# Patient Record
Sex: Female | Born: 1950 | Race: Black or African American | Hispanic: No | Marital: Married | Smoking: Never smoker
Health system: Southern US, Community
[De-identification: ages and names within clinical notes are randomized; demographics above are authoritative.]

## PROBLEM LIST (undated history)

## (undated) DIAGNOSIS — T7840XA Allergy, unspecified, initial encounter: Secondary | ICD-10-CM

## (undated) DIAGNOSIS — K219 Gastro-esophageal reflux disease without esophagitis: Secondary | ICD-10-CM

## (undated) DIAGNOSIS — Z9889 Other specified postprocedural states: Secondary | ICD-10-CM

## (undated) DIAGNOSIS — J45909 Unspecified asthma, uncomplicated: Secondary | ICD-10-CM

## (undated) DIAGNOSIS — M810 Age-related osteoporosis without current pathological fracture: Secondary | ICD-10-CM

## (undated) DIAGNOSIS — R112 Nausea with vomiting, unspecified: Secondary | ICD-10-CM

## (undated) DIAGNOSIS — Z9109 Other allergy status, other than to drugs and biological substances: Secondary | ICD-10-CM

## (undated) HISTORY — DX: Allergy, unspecified, initial encounter: T78.40XA

## (undated) HISTORY — DX: Unspecified asthma, uncomplicated: J45.909

## (undated) HISTORY — DX: Other allergy status, other than to drugs and biological substances: Z91.09

## (undated) HISTORY — PX: ROOT CANAL: SHX2363

---

## 1997-08-03 ENCOUNTER — Ambulatory Visit (HOSPITAL_COMMUNITY): Admission: RE | Admit: 1997-08-03 | Discharge: 1997-08-03 | Payer: Self-pay | Admitting: Family Medicine

## 1997-08-09 ENCOUNTER — Emergency Department (HOSPITAL_COMMUNITY): Admission: EM | Admit: 1997-08-09 | Discharge: 1997-08-10 | Payer: Self-pay | Admitting: Emergency Medicine

## 1997-10-13 ENCOUNTER — Other Ambulatory Visit: Admission: RE | Admit: 1997-10-13 | Discharge: 1997-10-13 | Payer: Self-pay | Admitting: Obstetrics and Gynecology

## 1998-01-20 ENCOUNTER — Ambulatory Visit (HOSPITAL_COMMUNITY): Admission: RE | Admit: 1998-01-20 | Discharge: 1998-01-20 | Payer: Self-pay | Admitting: Family Medicine

## 1998-01-20 ENCOUNTER — Encounter: Payer: Self-pay | Admitting: Family Medicine

## 1998-03-06 ENCOUNTER — Ambulatory Visit (HOSPITAL_COMMUNITY): Admission: RE | Admit: 1998-03-06 | Discharge: 1998-03-06 | Payer: Self-pay | Admitting: Gastroenterology

## 1998-03-06 ENCOUNTER — Encounter: Payer: Self-pay | Admitting: Gastroenterology

## 1998-03-08 ENCOUNTER — Encounter: Payer: Self-pay | Admitting: Gastroenterology

## 1998-03-08 ENCOUNTER — Ambulatory Visit (HOSPITAL_COMMUNITY): Admission: RE | Admit: 1998-03-08 | Discharge: 1998-03-08 | Payer: Self-pay | Admitting: Gastroenterology

## 2002-06-10 ENCOUNTER — Ambulatory Visit (HOSPITAL_COMMUNITY): Admission: RE | Admit: 2002-06-10 | Discharge: 2002-06-10 | Payer: Self-pay | Admitting: Gastroenterology

## 2002-06-10 ENCOUNTER — Encounter: Payer: Self-pay | Admitting: Gastroenterology

## 2002-06-16 ENCOUNTER — Encounter (INDEPENDENT_AMBULATORY_CARE_PROVIDER_SITE_OTHER): Payer: Self-pay | Admitting: Gastroenterology

## 2002-07-06 ENCOUNTER — Encounter (INDEPENDENT_AMBULATORY_CARE_PROVIDER_SITE_OTHER): Payer: Self-pay | Admitting: Gastroenterology

## 2002-07-29 ENCOUNTER — Other Ambulatory Visit: Admission: RE | Admit: 2002-07-29 | Discharge: 2002-07-29 | Payer: Self-pay | Admitting: Obstetrics and Gynecology

## 2002-10-22 ENCOUNTER — Encounter: Payer: Self-pay | Admitting: Emergency Medicine

## 2002-10-22 ENCOUNTER — Emergency Department (HOSPITAL_COMMUNITY): Admission: EM | Admit: 2002-10-22 | Discharge: 2002-10-22 | Payer: Self-pay | Admitting: Emergency Medicine

## 2004-04-11 ENCOUNTER — Ambulatory Visit: Payer: Self-pay | Admitting: Gastroenterology

## 2004-04-16 ENCOUNTER — Ambulatory Visit (HOSPITAL_COMMUNITY): Admission: RE | Admit: 2004-04-16 | Discharge: 2004-04-16 | Payer: Self-pay | Admitting: Gastroenterology

## 2004-04-16 ENCOUNTER — Encounter (INDEPENDENT_AMBULATORY_CARE_PROVIDER_SITE_OTHER): Payer: Self-pay | Admitting: Gastroenterology

## 2004-11-14 ENCOUNTER — Ambulatory Visit: Payer: Self-pay | Admitting: Gastroenterology

## 2004-11-16 ENCOUNTER — Ambulatory Visit (HOSPITAL_COMMUNITY): Admission: RE | Admit: 2004-11-16 | Discharge: 2004-11-16 | Payer: Self-pay | Admitting: Gastroenterology

## 2004-11-16 ENCOUNTER — Encounter (INDEPENDENT_AMBULATORY_CARE_PROVIDER_SITE_OTHER): Payer: Self-pay | Admitting: Gastroenterology

## 2005-12-04 ENCOUNTER — Ambulatory Visit: Payer: Self-pay | Admitting: Gastroenterology

## 2005-12-05 ENCOUNTER — Ambulatory Visit: Payer: Self-pay | Admitting: Gastroenterology

## 2006-07-23 ENCOUNTER — Ambulatory Visit: Payer: Self-pay | Admitting: Gastroenterology

## 2007-01-28 ENCOUNTER — Ambulatory Visit: Payer: Self-pay | Admitting: Internal Medicine

## 2007-04-22 ENCOUNTER — Ambulatory Visit: Payer: Self-pay | Admitting: Gastroenterology

## 2007-06-27 DIAGNOSIS — K209 Esophagitis, unspecified without bleeding: Secondary | ICD-10-CM | POA: Insufficient documentation

## 2007-06-27 DIAGNOSIS — K219 Gastro-esophageal reflux disease without esophagitis: Secondary | ICD-10-CM

## 2007-06-27 DIAGNOSIS — K449 Diaphragmatic hernia without obstruction or gangrene: Secondary | ICD-10-CM | POA: Insufficient documentation

## 2007-06-27 DIAGNOSIS — J309 Allergic rhinitis, unspecified: Secondary | ICD-10-CM | POA: Insufficient documentation

## 2007-06-27 DIAGNOSIS — D126 Benign neoplasm of colon, unspecified: Secondary | ICD-10-CM

## 2007-09-01 ENCOUNTER — Telehealth: Payer: Self-pay | Admitting: Gastroenterology

## 2008-06-06 ENCOUNTER — Ambulatory Visit: Payer: Self-pay | Admitting: Internal Medicine

## 2008-06-17 ENCOUNTER — Encounter: Payer: Self-pay | Admitting: Internal Medicine

## 2008-06-17 ENCOUNTER — Ambulatory Visit: Payer: Self-pay | Admitting: Internal Medicine

## 2008-06-21 ENCOUNTER — Encounter: Payer: Self-pay | Admitting: Internal Medicine

## 2008-11-10 ENCOUNTER — Emergency Department (HOSPITAL_COMMUNITY): Admission: EM | Admit: 2008-11-10 | Discharge: 2008-11-10 | Payer: Self-pay | Admitting: Family Medicine

## 2009-05-30 ENCOUNTER — Ambulatory Visit (HOSPITAL_COMMUNITY): Admission: RE | Admit: 2009-05-30 | Discharge: 2009-05-30 | Payer: Self-pay | Admitting: Internal Medicine

## 2010-05-05 ENCOUNTER — Inpatient Hospital Stay (INDEPENDENT_AMBULATORY_CARE_PROVIDER_SITE_OTHER)
Admission: RE | Admit: 2010-05-05 | Discharge: 2010-05-05 | Disposition: A | Discharge status: Home / Self Care | Payer: BC Managed Care – PPO | Source: Ambulatory Visit | Attending: Emergency Medicine | Admitting: Emergency Medicine

## 2010-05-05 DIAGNOSIS — M719 Bursopathy, unspecified: Secondary | ICD-10-CM

## 2010-05-05 DIAGNOSIS — R6889 Other general symptoms and signs: Secondary | ICD-10-CM

## 2010-06-19 ENCOUNTER — Emergency Department (HOSPITAL_COMMUNITY)
Admission: EM | Admit: 2010-06-19 | Discharge: 2010-06-19 | Disposition: A | Discharge status: Home / Self Care | Payer: BC Managed Care – PPO | Attending: Emergency Medicine | Admitting: Emergency Medicine

## 2010-06-19 ENCOUNTER — Emergency Department (HOSPITAL_COMMUNITY): Payer: BC Managed Care – PPO

## 2010-06-19 DIAGNOSIS — IMO0001 Reserved for inherently not codable concepts without codable children: Secondary | ICD-10-CM | POA: Insufficient documentation

## 2010-06-19 DIAGNOSIS — R42 Dizziness and giddiness: Secondary | ICD-10-CM | POA: Insufficient documentation

## 2010-06-19 DIAGNOSIS — M62838 Other muscle spasm: Secondary | ICD-10-CM | POA: Insufficient documentation

## 2010-06-19 DIAGNOSIS — R51 Headache: Secondary | ICD-10-CM | POA: Insufficient documentation

## 2010-06-19 DIAGNOSIS — K219 Gastro-esophageal reflux disease without esophagitis: Secondary | ICD-10-CM | POA: Insufficient documentation

## 2010-06-19 LAB — BASIC METABOLIC PANEL
BUN: 21 mg/dL (ref 6–23)
Creatinine, Ser: 0.63 mg/dL (ref 0.4–1.2)
GFR calc non Af Amer: >60 mL/min (ref >60)
Glucose, Bld: 95 mg/dL (ref 70–99)
Potassium: 4.1 mEq/L (ref 3.5–5.1)

## 2010-06-19 LAB — URINALYSIS, ROUTINE W REFLEX MICROSCOPIC
Bilirubin Urine: NEGATIVE
Glucose, UA: NEGATIVE mg/dL
Ketones, ur: NEGATIVE mg/dL
Nitrite: NEGATIVE
Protein, ur: NEGATIVE mg/dL
pH: 6 (ref 5.0–8.0)

## 2010-06-19 LAB — DIFFERENTIAL
Eosinophils Absolute: 0.1 10*3/uL (ref 0.0–0.7)
Eosinophils Relative: 1 % (ref 0–5)
Lymphocytes Relative: 40 % (ref 12–46)
Lymphs Abs: 1.8 10*3/uL (ref 0.7–4.0)
Monocytes Absolute: 0.3 10*3/uL (ref 0.1–1.0)
Monocytes Relative: 7 % (ref 3–12)

## 2010-06-19 LAB — CBC
HCT: 33.4 % — ABNORMAL LOW (ref 36.0–46.0)
MCH: 29.5 pg (ref 26.0–34.0)
MCHC: 34.1 g/dL (ref 30.0–36.0)
MCV: 86.5 fL (ref 78.0–100.0)
Platelets: 250 10*3/uL (ref 150–400)
RDW: 12.6 % (ref 11.5–15.5)
WBC: 4.6 10*3/uL (ref 4.0–10.5)

## 2010-06-26 NOTE — Assessment & Plan Note (Signed)
[facility]                         GASTROENTEROLOGY OFFICE NOTE   BEAUTIFUL, PENSYL                    MRN:          [id number]  DATE:04/22/2007                            DOB:          07/13/1950    PRIMARY CARE PHYSICIAN:  Holley Bouche, M.D.   GASTROINTESTINAL PROBLEM LIST:  1. Routine risk for colorectal cancer status post colonoscopy 2004      with a single lipoma removed.  Followup colonoscopy recommended      2014.  2. GERD status post endoscopy 1994 and 2004 by Dr. Victorino Dike.      Hiatal hernia was described with some mild esophagitis and      duodenitis were described as well.   INTERVAL HISTORY:  Karina Small was last seen here about three months  ago.  She was seen by Mike Gip for dyspepsia, exacerbation of GERD.  She was started on a trial of Protonix twice daily and then to be  changed to once daily.  A trial of Xifaxan was also given to her.  She  stopped the Xifaxan  because she believed it caused nausea and  headaches.  She continued on the Protonix intermittently, and I believe  she has been taking it with her breakfast meal probably four to five  times a week.  She no-showed for a followup appointment with me in late  December.  She no-showed for a followup appointment with Dr. Leone Payor in  late January.  She now is here for followup.  She believes that the  Protonix that she was started on did help with some classic GERD  symptoms such as pyrosis and acid water brash.  Recently, she started  Actonel, which is a once monthly diphosphonate and said that day she had  chest pains, worsening of her GERD, some epigastric discomfort.  She  called her primary care physician, who recommended she get on OTC  Prilosec daily and to stop her Amitiza, which she had been started on  for chronic intermittent diarrhea.  She also stopped her vitamin D and  her calcium.  Currently she is taking Prilosec twice daily and Protonix  once  in the morning.  Her biggest complaint is that of left lower and  left upper quadrant discomfort.   CURRENT MEDICATIONS:  1. Protonix 40 mg once daily.  2. Prilosec OTC twice daily.   PHYSICAL EXAMINATION:  Weight 136 pounds, which is one pound less than  she was three months ago.  Blood pressure 90/56, pulse 72.  CONSTITUTIONAL:  Generally well appearing.  ABDOMEN:  Soft, nontender, nondistended.  Normal bowel sounds.   ASSESSMENT/PLAN:  60 year old woman with history of  gastroesophageal reflux disease, esophagitis, recent worsening of the  symptoms since started taking Actonel.   Actonel certainly can cause esophageal ulceration, and so I think it is  reasonable for now that she hold that.  I also arranged for her to have  an EGD performed in the next few days to check if she does have  significant damage to her esophagus.  I have recommended she restart her  vitamin D and  her calcium.  I have recommended that she stop the  Prilosec OTC and instead use her Protonix on a daily basis, taking it 20  to 30 minutes prior to her breakfast meals as that is the way the pill  is designed to work most effectively.  I will call her in a  prescription.  She is not sure that she has any more of the Protonix.  I  see no reason for any further blood tests or imaging studies at this  time.  She does not drink much caffeine.  She does not smoke cigarettes  or drink much alcohol.     Rachael Fee, MD  Electronically Signed    DPJ/MedQ  DD: 04/22/2007  DT: 04/22/2007  Job #: [id number]   cc:   Holley Bouche, M.D.

## 2010-06-26 NOTE — Assessment & Plan Note (Signed)
[facility]                         GASTROENTEROLOGY OFFICE NOTE   [name], Karina Small                    MRN:          [id number]  DATE:07/23/2006                            DOB:          [birth year]    This nice patient comes in.  Says she still has chronic constipation and  some left upper quadrant pain.  She wondered whether she has worms.  I  told her I did not think this was the problem.  She has no bleeding.  She has weight gain and not a loss. She ask me about Amitiza.  I told  her this would be fine.  She should take that along with some Align.  I  thought she might do well.  I told her the gas and bloating was all from  probably constipation.  Hopefully the Amitiza can take care of this; if  not maybe MiraLax and prunes and bulking agents could be helpful to her.  She can take some laxatives, Senokot with Senna, Peri-Colace and some  Dulcolax if necessary to give her some relief.   PHYSICAL EXAMINATION:  VITAL SIGNS:  Weight 140, blood pressure 100/66,  pulse 76 and regular.  NECK: __________ are unremarkable.  HEENT:  Oropharynx negative.  NECK:  Negative.  CHEST:  Clear.  HEART:  Revealed regular rhythm with insignificant murmur.  ABDOMEN:  Soft, nontender.  No mass or organomegaly.   IMPRESSION:  1. Chronic constipation with some left upper quadrant pain.  2. Gastroesophageal reflux disease on no medication except for      symptomatic relief.  3. History of Intestinal gas.   RECOMMENDATIONS:  That we give her trial of Amitiza and Align.  Hopefully this will help.  She needed to have better daily bowel  activity.  She is trying to do whatever it takes to accomplish this and  whether she needs laxatives for this or not.  I think she understands  this, and I have told her to follow up with  Dr. Leone Payor in the future.     Ulyess Mort, MD  Electronically Signed    SML/MedQ  DD: 07/23/2006  DT: 07/24/2006  Job #: [id number]

## 2010-06-26 NOTE — Assessment & Plan Note (Signed)
[facility]                         GASTROENTEROLOGY OFFICE NOTE   WALKER, PADDACK                    MRN:          [id number]  DATE:01/28/2007                            DOB:          [birth year]    CHIEF COMPLAINT:  Heartburn, indigestion, epigastric discomfort.   HISTORY:  Karina Small is a pleasant 60 year old female known previously to  Dr. Victorino Dike, a primary patient of Dr. Tiburcio Pea.  She does have a  history of GERD and had been endoscoped previously in 1994 with a small  hiatal hernia and moderate gastritis.  She has also had endoscopy in  2004 which did show esophagitis and duodenitis.  Colonoscopy done in  2004 with one colon polyp, which was removed.  The path on this polyp  was consistent with a lipoma.  She will be due for follow-up colonoscopy  in 2014.   At this time, the patient says that she has been having problems over  the past month with increased reflux symptoms with sour-brash symptoms,  increased belching, and daily heartburn.  She has not been on any  regular medications.  No aspirin or NSAIDs.  No recent antibiotics.  She  denies any dysphagia or odynophagia.  She also complains of a gnawing-  type epigastric pain and feels as if there is a hole in her stomach.  She says that she feels hungry all of the time and feels like she has to  keep eating to fill the hole.  She has gained approximately 10 pounds  in the past year.  She has not had any vomiting but has had some  intermittent nausea.  She has had chronic problems with mild  constipation and is now having a bowel movement every 2-3 days.  Does  use prune juice, which she says generally works fairly well.  She has  not noted any melena or hematochezia.   CURRENT MEDICATIONS:  None.   ALLERGIES:  PREDNISONE with nausea.   PAST MEDICAL HISTORY:  Completely benign other than outlined above.   SOCIAL HISTORY:  Patient is married.  She is employed as a Scientist, physiological.  She is a nonsmoker and nondrinker.   FAMILY HISTORY:  Pertinent for breast cancer in her mother.  Diabetes in  one aunt.  Mother also had colon polyps.  No family history of colon  cancer or inflammatory bowel disease.   REVIEW OF SYSTEMS:  Pertinent for allergies, sinus symptoms.  She does  have some arthritic symptoms, particularly in her hands, low back pain,  and shoulder discomfort.  Review of systems is otherwise negative.   PHYSICAL EXAMINATION:  A well-developed female in no acute distress.  Pleasant.  Height is 5 foot 1.  Weight is 137.  Pulse is 78.  HEENT:  Normocephalic and atraumatic.  EOMI.  PERRLA.  Sclerae are  anicteric.  CARDIOVASCULAR:  Regular rate and rhythm with an S1 and S2.  No murmur,  rub or gallop.  PULMONARY:  Clear to A&P.  ABDOMEN:  Soft and nontender.  There is no palpable mass or  hepatosplenomegaly.  No guarding or rebound.  RECTAL:  Not done  at this time.   IMPRESSION:  65. A 60 year old female with exacerbation of esophageal reflux, rule      out esophagitis.  2. Gnawing epigastric discomfort, rule out gastritis or peptic ulcer      disease.  Question Helicobacter pylori.  3. Irritable bowel syndrome with mild chronic constipation and      complaints of abdominal bloating.   PLAN:  1. A trial of Protonix 1 p.o. b.i.d. x1 week, then q.a.m. thereafter.  2. Trial of Xifaxan 200 mg 2 p.o. t.i.d. x10 days for IBS/bloating      symptoms.  3. Follow up with Dr. Leone Payor in 3-4 weeks.  If her upper GI symptoms      have not resolved, she may require EGD.      Mike Gip, PA-C  Electronically Signed      Rachael Fee, MD  Electronically Signed   AE/MedQ  DD: 01/30/2007  DT: 01/31/2007  Job #: [id number]   cc:   Iva Boop, MD,FACG

## 2010-06-29 NOTE — Assessment & Plan Note (Signed)
[facility]                           GASTROENTEROLOGY OFFICE NOTE   Karina Small, Karina Small                    MRN:          [id number]  DATE:12/04/2005                            DOB:          Jun 28, 1950    The patient comes in on October 24 describing increasing belching and  burning, especially in the throat, increasing abdominal gas and bloating,  especially over the past several weeks.  She has been having 1 or 2 bowel  movements a day.  They have been normal.  She states there is no blood.  She  does have some left lower quadrant pain at times.  This has been going on  for several weeks too.  It has been intermittent.  She has some nausea, but  nothing significant.  No fevers or chills.  She said she thinks that, if she  went back on her Nexium she would get a better result.  States the gas is in  her left flank area and this is what she describes as gas, but she says with  a deep breath it gets worse.  She states when she eats it does not fill her  up.  She has bad headaches if she is hungry.  She says if she drinks water  this seems to control the gas and belching to some degree.  She has no  change in her urination.   I have reviewed her chart and it indicates that she had a polypoid lesion  removed from her transverse colon, which appeared to be a lipoma, which  pathologically was a lipoma several years previous, and the remaining colon  really did not reveal any significant pathology.  The exact date was Jul 06, 2002.  She had an upper endoscopic examination on Jun 16, 2002, which  revealed a minimum amount of esophagitis and a prolapse of the antral mucosa  into the pyloric channel, which caused a partial obstructive component.  Otherwise, the mucosa itself of the duodenum was slightly granular and  probably not with any significant abnormality.  She has had some erosions on  previous endoscopic procedures.  When seen in October of 2006,  described  some recurrent abdominal pain with some constipation and some GERD with  throat burning, somewhat the same kind of symptoms she has now.  She  described at that time her stomach hurting all over, mostly the left  quadrant, as she does now, except this is the left flank.  She was using  MiraLax to help her daily bowel activity, Rolaids, and Maalox, but these  were not very helpful.  She said Nexium was not very helpful.  She says her  stomach feels sour at times and things she brought up, feeling empty.  I did  an upper GI and small bowel series to see if there was any other  obstruction, and I put her on some Reglan at that time, and I felt like this  was all an irritable bowel-type disorder.  Her upper GI and small bowel  series were essentially normal.  She had a CT of her abdomen in  2006, which  was basically unremarkable, except for noting stool all around her colon and  changes of mild constipation.  She had a CT scan of her upper abdomen and  noted an ultrasound of her abdomen as well, which was normal.   PHYSICAL EXAMINATION:  She weighed 143, blood pressure 90/50, pulse 60 and  regular.  OROPHARYNX:  Negative.  NECK:  Negative.  CHEST:  Clear.  HEART:  Regular rate and rhythm without significant murmur.  ABDOMEN:  Soft with no mass or organomegaly and nontender.   IMPRESSION:  1. Left costovertebral angle pain of questionable etiology.  2. History of irritable bowel syndrome with constipation component.  3. Gastroesophageal reflux disease with throat burning and acid reflux      that responds fairly well to Nexium.  4. Increased intestinal gas of questionable etiology, possibly food-      related, swallowing air, anxiety, depression.   RECOMMENDATIONS:  Get an ultrasound of her abdomen again because of her  pain.  Give her a diet of things that cause intestinal gas.  Start her on  some Nexium b.i.d.  Give her some Align to take 1 q. day.  I am going to  have her  get some routine labs, amylase, and lipase, and have her return in  followup in 3 to 4 weeks.            ______________________________  Ulyess Mort, MD      SML/MedQ  DD:  12/04/2005  DT:  12/05/2005  Job #:  [id number]

## 2010-08-08 ENCOUNTER — Ambulatory Visit (HOSPITAL_COMMUNITY): Payer: BC Managed Care – PPO | Attending: Internal Medicine

## 2010-08-08 DIAGNOSIS — M810 Age-related osteoporosis without current pathological fracture: Secondary | ICD-10-CM | POA: Insufficient documentation

## 2010-08-10 ENCOUNTER — Encounter (HOSPITAL_COMMUNITY): Payer: BC Managed Care – PPO

## 2011-11-19 ENCOUNTER — Encounter (HOSPITAL_COMMUNITY): Payer: Self-pay

## 2011-11-19 ENCOUNTER — Encounter (HOSPITAL_COMMUNITY): Payer: BC Managed Care – PPO

## 2011-11-19 ENCOUNTER — Encounter (HOSPITAL_COMMUNITY)
Admission: RE | Admit: 2011-11-19 | Discharge: 2011-11-19 | Disposition: A | Discharge status: Home / Self Care | Payer: BC Managed Care – PPO | Source: Ambulatory Visit | Attending: Internal Medicine | Admitting: Internal Medicine

## 2011-11-19 DIAGNOSIS — M810 Age-related osteoporosis without current pathological fracture: Secondary | ICD-10-CM | POA: Insufficient documentation

## 2011-11-19 HISTORY — DX: Age-related osteoporosis without current pathological fracture: M81.0

## 2011-11-19 HISTORY — DX: Gastro-esophageal reflux disease without esophagitis: K21.9

## 2011-11-19 MED ORDER — SODIUM CHLORIDE 0.9 % IV SOLN
Freq: Once | INTRAVENOUS | Status: AC
  Administered 2011-11-19: 15:00:00 via INTRAVENOUS

## 2011-11-19 MED ORDER — ZOLEDRONIC ACID 5 MG/100ML IV SOLN
5.0000 mg | Freq: Once | INTRAVENOUS | Status: AC
  Administered 2011-11-19: 5 mg via INTRAVENOUS
  Filled 2011-11-19: qty 100

## 2012-03-16 ENCOUNTER — Encounter: Payer: Self-pay | Admitting: Obstetrics and Gynecology

## 2012-03-16 ENCOUNTER — Ambulatory Visit: Payer: BC Managed Care – PPO | Admitting: Obstetrics and Gynecology

## 2012-03-16 VITALS — BP 92/58 | Ht 61.0 in | Wt 112.0 lb

## 2012-03-16 DIAGNOSIS — Z01419 Encounter for gynecological examination (general) (routine) without abnormal findings: Secondary | ICD-10-CM

## 2012-03-16 DIAGNOSIS — N63 Unspecified lump in unspecified breast: Secondary | ICD-10-CM | POA: Insufficient documentation

## 2012-03-16 DIAGNOSIS — Z124 Encounter for screening for malignant neoplasm of cervix: Secondary | ICD-10-CM

## 2012-03-16 NOTE — Progress Notes (Signed)
Subjective:    Karina Small is a 62 y.o. female G0P0000 who presents for annual exam. The patient has not been seen and greater than 3 years.  This is considered a new patient exam.  The patient has a known history of herpes.  She has very few to no outbreaks.The patient complaints of trouble sleeping. She has been diagnosed with osteoporosis.  She is caring for her elderly mother.  The following portions of the patient's history were reviewed and updated as appropriate: allergies, current medications, past family history, past medical history, past social history, past surgical history and problem list.  Review of Systems A comprehensive review of systems was negative. Gastrointestinal:No change in bowel habits, no abdominal pain, no rectal bleeding Genitourinary:negative for dysuria, frequency, hematuria, nocturia and urinary incontinence    Objective:     BP 92/58  Ht 5\' 1"  (1.549 m)  Wt 112 lb (50.803 kg)  BMI 21.16 kg/m2  LMP 04/04/1982  Weight:  Wt Readings from Last 1 Encounters:  03/16/12 112 lb (50.803 kg)     BMI: Body mass index is 21.16 kg/(m^2). General Appearance: Alert, appropriate appearance for age. No acute distress HEENT: Grossly normal Neck / Thyroid: Supple, no masses, nodes or enlargement Lungs: clear to auscultation bilaterally Back: No CVA tenderness Breast Exam: no skin changes or discharge from her breasts.  No masses on the right.  There is a 1 cm mobile nontender mass in the left lower outer quadrant. Cardiovascular: Regular rate and rhythm. S1, S2, no murmur Gastrointestinal: Soft, non-tender, no masses or organomegaly  ++++++++++++++++++++++++++++++++++++++++++++++++++++++++  Pelvic Exam: External genitalia: normal general appearance Vaginal: atrophic mucosa Cervix: normal appearance Adnexa: normal bimanual exam Uterus: small, nontender Rectovaginal: normal rectal, no  masses  ++++++++++++++++++++++++++++++++++++++++++++++++++++++++  Lymphatic Exam: Non-palpable nodes in neck, clavicular, axillary, or inguinal regions  Psychiatric: Alert and oriented, appropriate affect.      Assessment:    Normal gyn exam   Overweight or obese: No  Pelvic relaxation: Yes  Menopausal symptoms: Yes. Severe: No.  Left breast mass  Herpes virus-few outbreaks   Plan:    Mammogram. Pap smear.   Follow-up:  1 month to repeat breast exam.  The updated Pap smear screening guidelines were discussed with the patient. The patient requested that I obtain a Pap smear: Yes.  Kegel exercises discussed: Yes.  Proper diet and regular exercise were reviewed.  Annual mammograms recommended starting at age 69. Proper breast care was discussed.  Screening colonoscopy is recommended beginning at age 42.  Regular health maintenance was reviewed.  Sleep hygiene was discussed.  Adequate calcium and vitamin D intake was emphasized.  Leonard Schwartz M.D.   Regular Periods: Postmenopausal Mammogram: yes  Monthly Breast Ex.: yes  Exercise: yes  Tetanus < 10 years: yes Seatbelts: yes  NI. Bladder Functn.: yes Abuse at home: no  Daily BM's: yes Stressful Work: yes  Healthy Diet: yes Sigmoid-Colonoscopy: per pt 2000  Calcium: yes Medical problems this year: shoulder    LAST PAP:12/16/2007 Contraception: none  Mammogram:  04/15/2006  PCP: Dr Cato Mulligan   PMH: none  FMH: none  Last Bone Scan: per pt 2013 Osteoporosis

## 2012-03-17 ENCOUNTER — Other Ambulatory Visit: Payer: Self-pay | Admitting: Obstetrics and Gynecology

## 2012-03-17 DIAGNOSIS — N63 Unspecified lump in unspecified breast: Secondary | ICD-10-CM

## 2012-03-17 LAB — PAP IG W/ RFLX HPV ASCU

## 2012-03-21 IMAGING — CT CT HEAD W/O CM
1 of 2 series · 16 of 30 positions shown, 20 images · non-contrast
Comparison: None.

CLINICAL DATA: Headache, dizziness

CT HEAD WITHOUT CONTRAST
TECHNIQUE: Contiguous axial images were obtained from the base of
the skull through the vertex without contrast

[Series 2: head routine 4.8 h37s · axial · 0.43mm/px · z∈[+1170,+1294]mm · 16 of 30 slices shown, 20 images]
[im 2/30  brain]
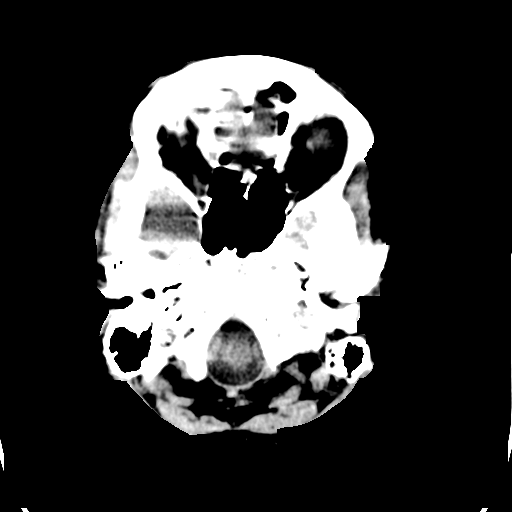
[im 2/30  bone]
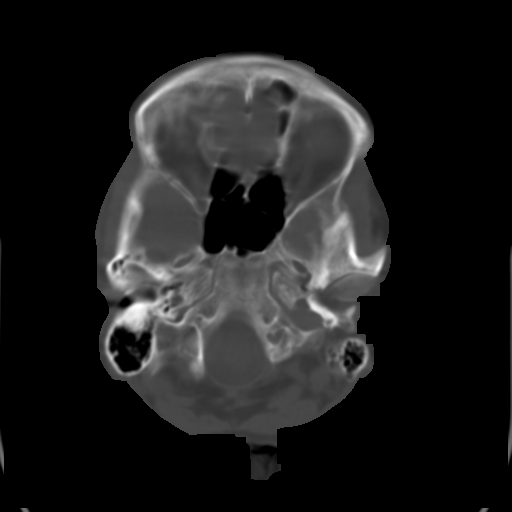
[im 3/30  brain]
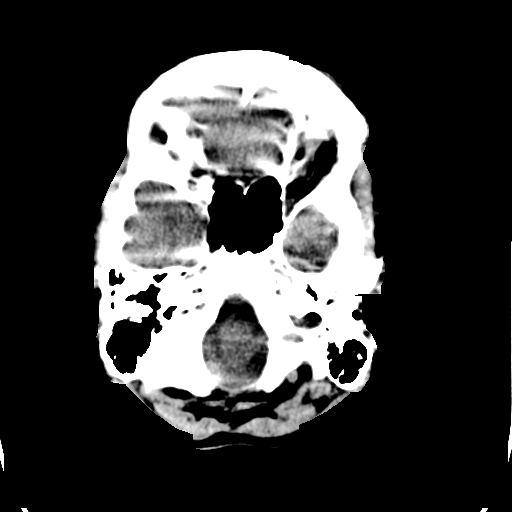
[im 5/30  brain]
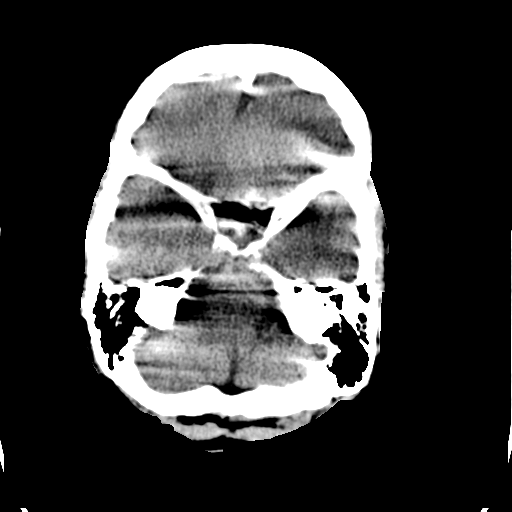
[im 8/30  brain]
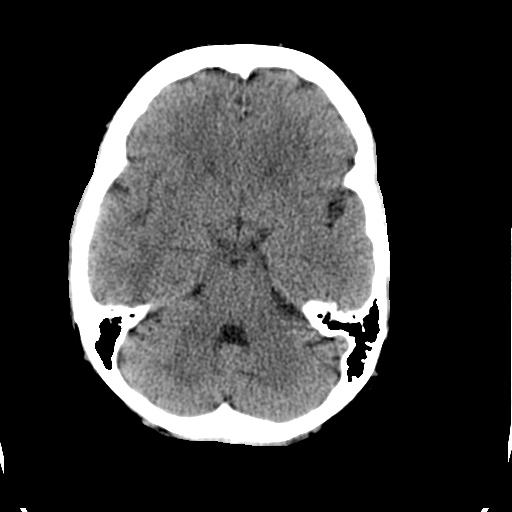
[im 9/30  brain]
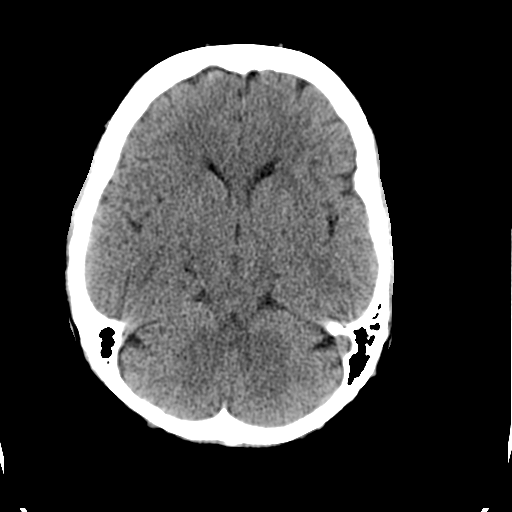
[im 9/30  bone]
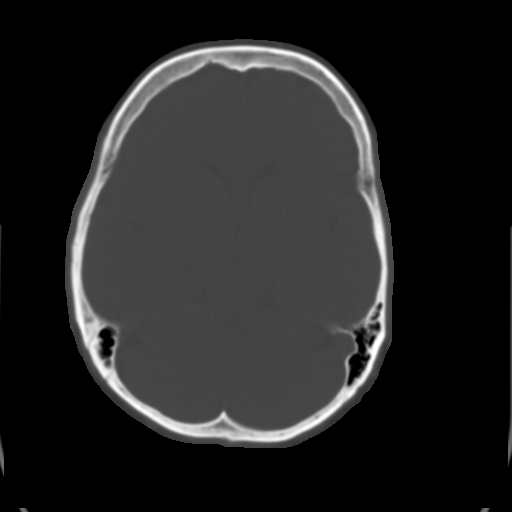
[im 11/30  brain]
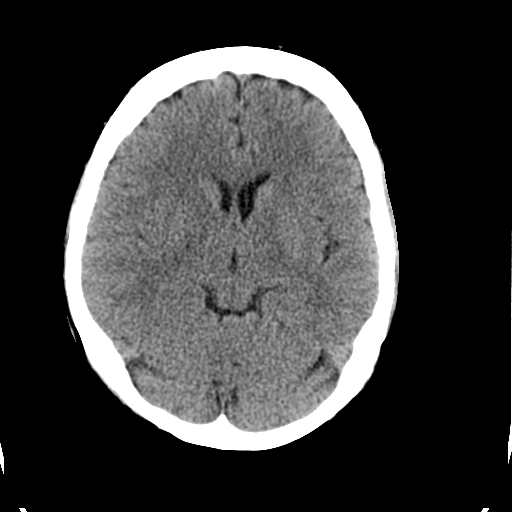
[im 12/30  brain]
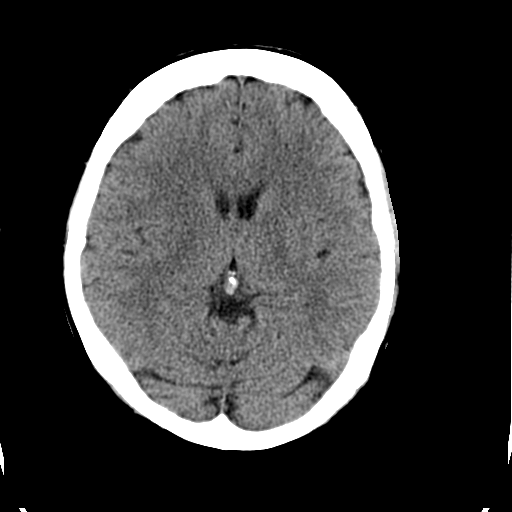
[im 14/30  brain]
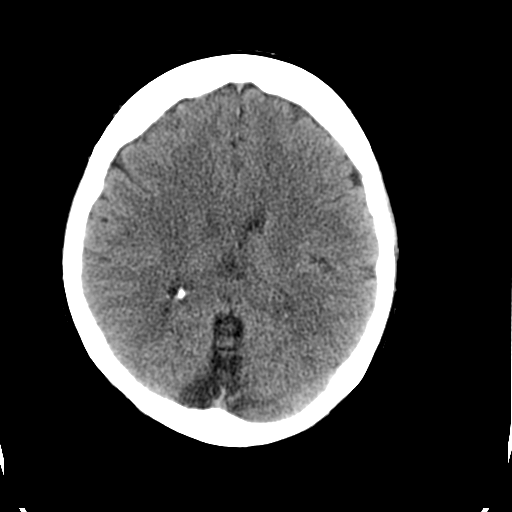
[im 16/30  brain]
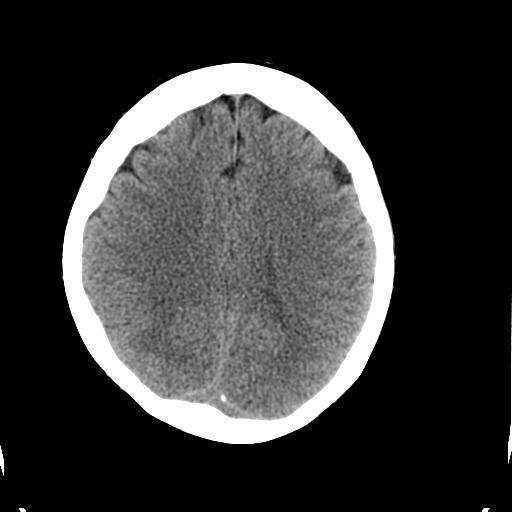
[im 16/30  bone]
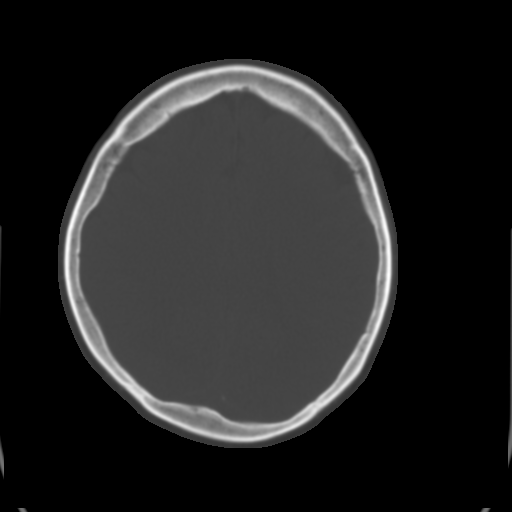
[im 18/30  brain]
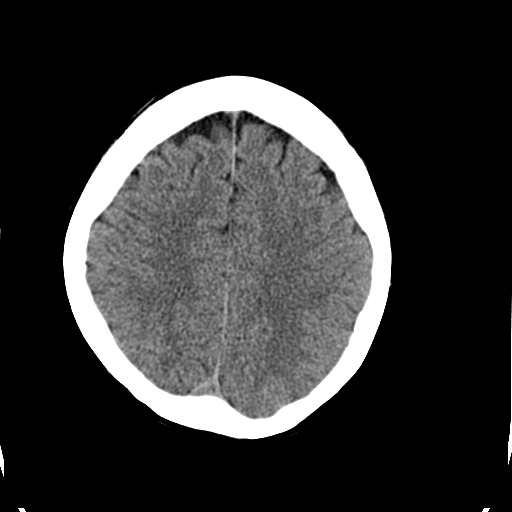
[im 19/30  brain]
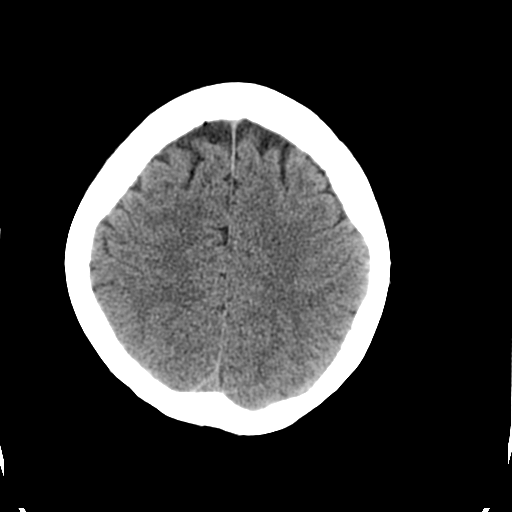
[im 21/30  brain]
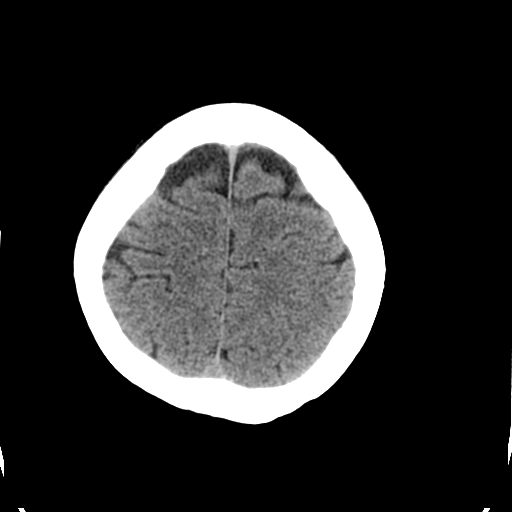
[im 22/30  brain]
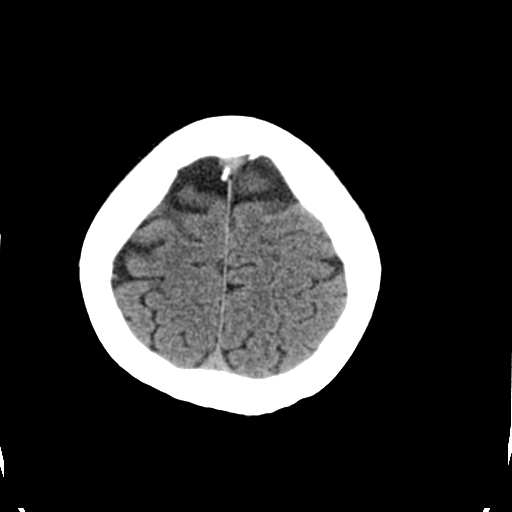
[im 22/30  bone]
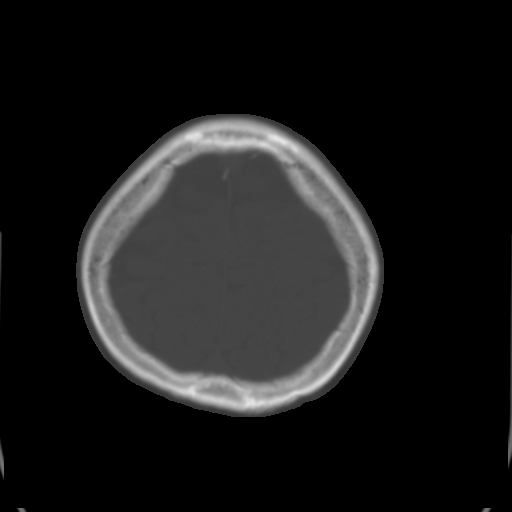
[im 25/30  brain]
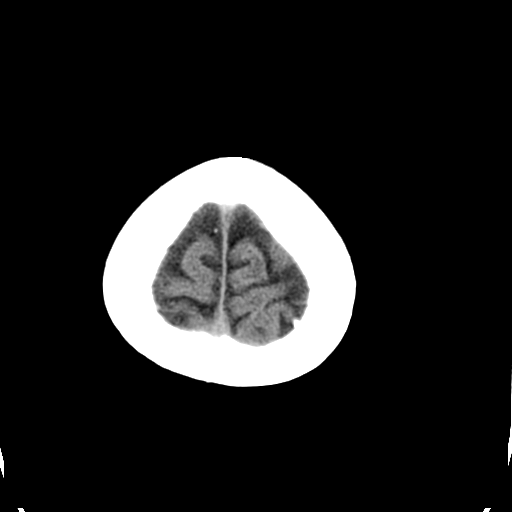
[im 27/30  brain]
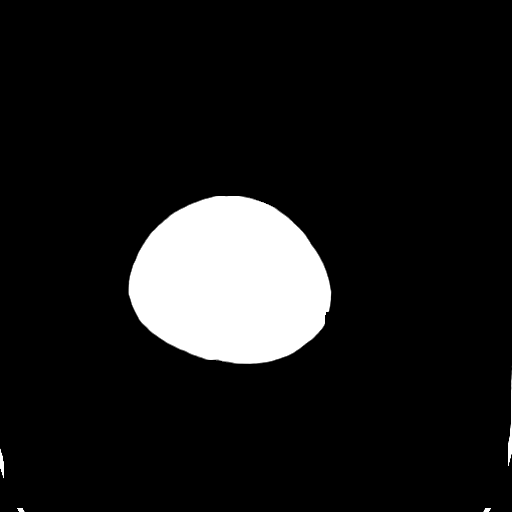
[im 28/30  brain]
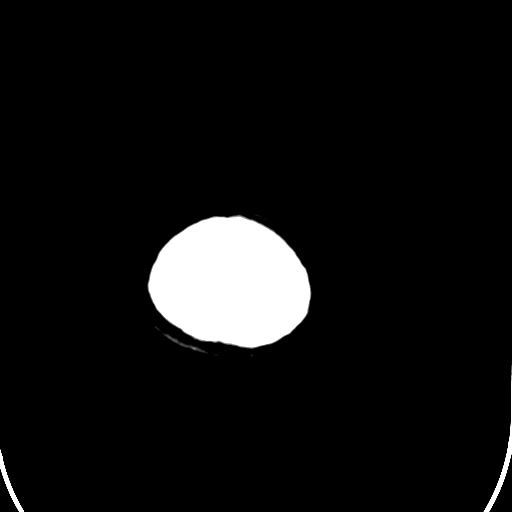

[16 of 30 positions shown; findings below may reference images not displayed]

FINDINGS: The brain has a normal appearance without evidence for
hemorrhage, acute infarction, hydrocephalus, or mass lesion.  There
is no extra axial fluid collection.  The skull and paranasal
sinuses are normal.
IMPRESSION: Normal CT of the head without contrast.

## 2012-09-18 ENCOUNTER — Emergency Department (HOSPITAL_COMMUNITY): Payer: BC Managed Care – PPO

## 2012-09-18 ENCOUNTER — Emergency Department (INDEPENDENT_AMBULATORY_CARE_PROVIDER_SITE_OTHER)
Admission: EM | Admit: 2012-09-18 | Discharge: 2012-09-18 | Disposition: A | Discharge status: Home / Self Care | Payer: BC Managed Care – PPO | Source: Home / Self Care | Attending: Family Medicine | Admitting: Family Medicine

## 2012-09-18 ENCOUNTER — Encounter (HOSPITAL_COMMUNITY): Payer: Self-pay

## 2012-09-18 DIAGNOSIS — M7918 Myalgia, other site: Secondary | ICD-10-CM

## 2012-09-18 DIAGNOSIS — IMO0001 Reserved for inherently not codable concepts without codable children: Secondary | ICD-10-CM

## 2012-09-18 MED ORDER — IBUPROFEN 600 MG PO TABS: 600.0000 mg | ORAL_TABLET | Freq: Four times a day (QID) | ORAL | Status: DC | PRN

## 2012-09-18 MED ORDER — CYCLOBENZAPRINE HCL 10 MG PO TABS: 10.0000 mg | ORAL_TABLET | Freq: Three times a day (TID) | ORAL | Status: DC | PRN
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
## 2012-09-21 NOTE — ED Provider Notes (Signed)
Medical screening examination/treatment/procedure(s) were performed by resident physician or non-physician practitioner and as supervising physician I was immediately available for consultation/collaboration.   KINDL,JAMES DOUGLAS MD.   James D Kindl, MD 09/21/12 1509 

## 2013-02-05 ENCOUNTER — Emergency Department (HOSPITAL_COMMUNITY)
Admission: EM | Admit: 2013-02-05 | Discharge: 2013-02-05 | Disposition: A | Discharge status: Home / Self Care | Payer: BC Managed Care – PPO | Source: Home / Self Care

## 2013-02-05 ENCOUNTER — Encounter (HOSPITAL_COMMUNITY): Payer: Self-pay | Admitting: Emergency Medicine

## 2013-02-05 DIAGNOSIS — J069 Acute upper respiratory infection, unspecified: Secondary | ICD-10-CM

## 2013-02-05 MED ORDER — IPRATROPIUM BROMIDE 0.06 % NA SOLN: 2.0000 | Freq: Four times a day (QID) | NASAL | Status: DC

## 2013-02-05 NOTE — ED Provider Notes (Signed)
Medical screening examination/treatment/procedure(s) were performed by non-physician practitioner and as supervising physician I was immediately available for consultation/collaboration.  Leslee Home, M.D.  Reuben Likes, MD 02/05/13 2217

## 2013-02-05 NOTE — ED Provider Notes (Signed)
CSN: [id number]     Arrival date & time 02/05/13  1143 History   First MD Initiated Contact with Patient 02/05/13 1415     No chief complaint on file.  (Consider location/radiation/quality/duration/timing/severity/associated sxs/prior Treatment) HPI Comments: And 62 year old female complaining of cough and cold. Started approximately 3 days ago with a cough and developing a headache and drainage in her throat. She has a scratchy and sore throat. Today she complaining of a deep cough. Denies fever, shortness of breath or chest pain. She is taking Mucinex DM.   Past Medical History  Diagnosis Date  . Osteoporosis   . GERD (gastroesophageal reflux disease)   . Environmental allergies    Past Surgical History  Procedure Laterality Date  . Root canal     Family History  Problem Relation Age of Onset  . Heart attack Father   . Breast cancer Mother   . Osteoporosis Mother    History  Substance Use Topics  . Smoking status: Never Smoker   . Smokeless tobacco: Never Used  . Alcohol Use: No   OB History   Grav Para Term Preterm Abortions TAB SAB Ect Mult Living   0 0 0 0 0 0 0 0 0 0      Review of Systems  Constitutional: Negative for fever, chills, activity change, appetite change and fatigue.  HENT: Positive for congestion, postnasal drip and rhinorrhea. Negative for facial swelling.   Eyes: Negative.   Respiratory: Positive for cough. Negative for shortness of breath and wheezing.   Cardiovascular: Negative.   Gastrointestinal: Negative.   Musculoskeletal: Negative for neck pain and neck stiffness.  Skin: Negative for pallor and rash.  Neurological: Negative.     Allergies  Prednisone and Risedronate sodium  Home Medications   Current Outpatient Rx  Name  Route  Sig  Dispense  Refill  . Calcium Carbonate-Vitamin D (LIQUID CALCIUM/VITAMIN D PO)   Oral   Take by mouth 2 (two) times daily.         . cyclobenzaprine (FLEXERIL) 10 MG tablet   Oral   Take 1 tablet  (10 mg total) by mouth 3 (three) times daily as needed for muscle spasms.   30 tablet   0   . ibuprofen (ADVIL,MOTRIN) 600 MG tablet   Oral   Take 1 tablet (600 mg total) by mouth every 6 (six) hours as needed for pain.   30 tablet   0   . ipratropium (ATROVENT) 0.06 % nasal spray   Each Nare   Place 2 sprays into both nostrils 4 (four) times daily.   15 mL   12   . loratadine (CLARITIN) 10 MG tablet   Oral   Take 10 mg by mouth as needed.         . ranitidine (ZANTAC) 150 MG tablet   Oral   Take 150 mg by mouth as needed.         . zoledronic acid (RECLAST) 5 MG/100ML SOLN   Intravenous   Inject 5 mg into the vein once.          BP 119/67  Pulse 72  Temp(Src) 98.8 F (37.1 C) (Oral)  Resp 16  Ht 5\' 1"  (1.549 m)  Wt 115 lb (52.164 kg)  BMI 21.74 kg/m2  SpO2 100%  LMP 04/04/1982 Physical Exam  Nursing note and vitals reviewed. Constitutional: She is oriented to person, place, and time. She appears well-developed and well-nourished. No distress.  HENT:  Mouth/Throat: No oropharyngeal exudate.  Bilateral TMs are occluded with wax. Oropharynx with minor erythema and injection, copious clear PND.  Eyes: Conjunctivae and EOM are normal.  Neck: Normal range of motion. Neck supple.  Cardiovascular: Normal rate, regular rhythm and normal heart sounds.   Pulmonary/Chest: Effort normal and breath sounds normal. No respiratory distress. She has no wheezes. She has no rales.  Musculoskeletal: Normal range of motion. She exhibits no edema.  Lymphadenopathy:    She has no cervical adenopathy.  Neurological: She is alert and oriented to person, place, and time.  Skin: Skin is warm and dry. No rash noted.  Psychiatric: She has a normal mood and affect.    ED Course  Procedures (including critical care time) Labs Review Labs Reviewed - No data to display Imaging Review No results found.    MDM   1. URI (upper respiratory infection)      Continue the  Mucinex DM and had an Alka-Seltzer cold plus. Also Atrovent nasal spray as directed. May also use nasal saline spray frequently, Tylenol when necessary and regular URI instructions.  Hayden Rasmussen, NP 02/05/13 [phone]

## 2013-02-05 NOTE — ED Notes (Signed)
Cough, headache , uri

## 2014-08-19 ENCOUNTER — Encounter: Payer: Self-pay | Admitting: Gastroenterology

## 2014-08-19 ENCOUNTER — Encounter: Payer: Self-pay | Admitting: Internal Medicine

## 2014-09-29 ENCOUNTER — Ambulatory Visit (HOSPITAL_COMMUNITY)
Admission: RE | Admit: 2014-09-29 | Discharge: 2014-09-29 | Disposition: A | Discharge status: Home / Self Care | Payer: BC Managed Care – PPO | Source: Ambulatory Visit | Attending: Internal Medicine | Admitting: Internal Medicine

## 2014-09-29 ENCOUNTER — Encounter (HOSPITAL_COMMUNITY): Payer: Self-pay

## 2014-09-29 ENCOUNTER — Encounter (INDEPENDENT_AMBULATORY_CARE_PROVIDER_SITE_OTHER): Payer: Self-pay

## 2014-09-29 ENCOUNTER — Other Ambulatory Visit (HOSPITAL_COMMUNITY): Payer: Self-pay | Admitting: Internal Medicine

## 2014-09-29 DIAGNOSIS — M810 Age-related osteoporosis without current pathological fracture: Secondary | ICD-10-CM | POA: Diagnosis not present

## 2014-09-29 HISTORY — DX: Other specified postprocedural states: Z98.890

## 2014-09-29 HISTORY — DX: Nausea with vomiting, unspecified: R11.2

## 2014-09-29 MED ORDER — SODIUM CHLORIDE 0.9 % IV SOLN
Freq: Once | INTRAVENOUS | Status: AC
  Administered 2014-09-29: 10:00:00 via INTRAVENOUS

## 2014-09-29 MED ORDER — ZOLEDRONIC ACID 5 MG/100ML IV SOLN
5.0000 mg | Freq: Once | INTRAVENOUS | Status: AC
  Administered 2014-09-29: 5 mg via INTRAVENOUS
  Filled 2014-09-29: qty 100

## 2014-09-29 NOTE — Discharge Instructions (Signed)

## 2015-04-05 ENCOUNTER — Encounter: Payer: Self-pay | Admitting: Podiatry

## 2015-04-05 ENCOUNTER — Ambulatory Visit (INDEPENDENT_AMBULATORY_CARE_PROVIDER_SITE_OTHER): Payer: BC Managed Care – PPO | Admitting: Podiatry

## 2015-04-05 VITALS — BP 115/73 | HR 74 | Resp 14

## 2015-04-05 DIAGNOSIS — L081 Erythrasma: Secondary | ICD-10-CM | POA: Diagnosis not present

## 2015-04-05 DIAGNOSIS — B353 Tinea pedis: Secondary | ICD-10-CM

## 2015-04-05 MED ORDER — ERYTHROMYCIN BASE 500 MG PO TABS: 500.0000 mg | ORAL_TABLET | Freq: Two times a day (BID) | ORAL | Status: DC

## 2015-04-05 NOTE — Progress Notes (Signed)
   Subjective:    Patient ID: Karina Small, female    DOB: 1950/12/11, 65 y.o.   MRN: [id number]  HPI this patient presents to the office with chief complaint of itching on both feet. Her second complaint is her skin between her third and fourth and fourth and fifth toes has broken down, exposing her tissue. She says that the blisters on both feet are causing her to itch and she has purchased and applied tinactin  cream to the sites. She has not applied any medication between the toes of the left foot. She states that there is no pain associated with the toes on the left foot. She presents the office for an evaluation and treatment   The patient presents here today for left foot, 4th and 5th toes broken skin in between the toes and the outer 5th toe since 1 month. Review of Systems     Objective:   Physical Exam GENERAL APPEARANCE: Alert, conversant. Appropriately groomed. No acute distress.  VASCULAR: Pedal pulses palpable at  [facility] and PT bilateral.  Capillary refill time is immediate to all digits,  Normal temperature gradient.  Digital hair growth is present bilateral  NEUROLOGIC: sensation is normal to 5.07 monofilament at 5/5 sites bilateral.  Light touch is intact bilateral, Muscle strength normal.  MUSCULOSKELETAL: acceptable muscle strength, tone and stability bilateral.  Intrinsic muscluature intact bilateral.  Rectus appearance of foot and digits noted bilateral.   DERMATOLOGIC: skin color, texture, and turgor are within normal limits.  No preulcerative lesions or ulcers  are seen, no interdigital maceration noted.  No open lesions present.  Digital nails are asymptomatic. No drainage noted. She has blister formation on dorsolateral aspect left foot at 5th PIPJ. There is ulceration between her 3/4 and 4/5 toes left foot.         Assessment & Plan:  Tinea pedis B/L   Erythrasma left foot.  ROV  Told to bandage and apply betadine cream between toes as well as prescribed  erythromycin 500 mg one bid.  Use tinactin since the blisters are drying.  RTC 10 days.

## 2015-04-13 ENCOUNTER — Ambulatory Visit (INDEPENDENT_AMBULATORY_CARE_PROVIDER_SITE_OTHER): Payer: BC Managed Care – PPO | Admitting: Podiatry

## 2015-04-13 ENCOUNTER — Encounter: Payer: Self-pay | Admitting: Podiatry

## 2015-04-13 VITALS — BP 105/61 | HR 84 | Resp 12

## 2015-04-13 DIAGNOSIS — B353 Tinea pedis: Secondary | ICD-10-CM | POA: Diagnosis not present

## 2015-04-13 DIAGNOSIS — L081 Erythrasma: Secondary | ICD-10-CM | POA: Diagnosis not present

## 2015-04-13 NOTE — Progress Notes (Signed)
Subjective:     Patient ID: Karina Small, female   DOB: 1950/05/16, 65 y.o.   MRN: [id number]  HPI this patient presents the office follow-up for diagnosis of tinea pedis both feet and erythrasma third and fourth inner spaces left foot. She has been applying medication to the interspace as well as taking erythromycin for the erythrasma. She has used the Tinactin to help dry the blisters formed from her tinea pedis patient was having no further symptoms at this time   Review of Systems     Objective:   Physical Examneurovascular status intact.  Her multiple blisters have dried.  Her erythrasma has been replaced with normal skin.     Assessment:     Tinea pedis  Erythrasma     Plan:     **ROV  Discharge.  RTC prn   Gardiner Barefoot DPM

## 2015-04-26 ENCOUNTER — Telehealth: Payer: Self-pay | Admitting: *Deleted

## 2015-04-26 NOTE — Telephone Encounter (Signed)
Pt states she had performed the soaks and used the cream on her feet, and the areas dried up, but 2-3 days later the left toe opened up again.  I encouraged pt to continue the Dr. Burnell Blanks soaks, and the cream and to be sure to dry areas completely after baths, and soaks and to allow to air dry when possible.  Pt states she has an appt 05/04/2015, but would like to know if she should be on the antibiotic Dr. Prudence Davidson prescribed again.

## 2015-05-01 ENCOUNTER — Telehealth: Payer: Self-pay | Admitting: *Deleted

## 2015-05-01 MED ORDER — ERYTHROMYCIN BASE 500 MG PO TABS: 500.0000 mg | ORAL_TABLET | Freq: Two times a day (BID) | ORAL | Status: DC

## 2015-05-01 NOTE — Telephone Encounter (Addendum)
Dr. Prudence Davidson called states refill Karina Small's E-Mycin.  Informed Karina Small and refilled as previously.

## 2015-05-04 ENCOUNTER — Ambulatory Visit (INDEPENDENT_AMBULATORY_CARE_PROVIDER_SITE_OTHER): Payer: BC Managed Care – PPO | Admitting: Podiatry

## 2015-05-04 DIAGNOSIS — B353 Tinea pedis: Secondary | ICD-10-CM | POA: Diagnosis not present

## 2015-05-04 DIAGNOSIS — L081 Erythrasma: Secondary | ICD-10-CM | POA: Diagnosis not present

## 2015-05-04 NOTE — Progress Notes (Signed)
Subjective:     Patient ID: Karina Small, female   DOB: March 02, 1950, 65 y.o.   MRN: [id number]  HPI His patient presents to the office complaining of active infection to the skin between her 4,5 toes left foot.  She was seen weeks ago and diagnosed with tinea pedis both feet and c. minitissum 4th interspace left foot.  The tinea pedis has dried and is not causing any problems.   The 4th interspace has started draining again after it initially responded to e-mycin.  He was given a refill this past Tuesday and has taken the medicine for only 2 days.  The interspace has started improving but she desires local treatment. She presents for evaluation and treatment.   Review of Systems     Objective:   Physical Exam GENERAL APPEARANCE: Alert, conversant. Appropriately groomed. No acute distress.  VASCULAR: Pedal pulses are  palpable at  [facility] and PT bilateral.  Capillary refill time is immediate to all digits,  Normal temperature gradient.  Digital hair growth is present bilateral  NEUROLOGIC: sensation is normal to 5.07 monofilament at 5/5 sites bilateral.  Light touch is intact bilateral, Muscle strength normal.  MUSCULOSKELETAL: acceptable muscle strength, tone and stability bilateral.  Intrinsic muscluature intact bilateral.  Rectus appearance of foot and digits noted bilateral.   DERMATOLOGIC: skin color, texture, and turgor are within normal limits.  No preulcerative lesions or ulcers  are seen,   No open lesions present.  Digital nails are asymptomatic. There is masceration and drainage to the skin fourth interspace left foot.  Return of her initial infection noted.      Assessment:     Skin infection/ fungal infection  4th interspace left foot.     Plan:     ROV. Told her to soak in tea or vinegar.  Told her to apply lamisil or lotrimin cream locally to interspace.  RTC prn   Gardiner Barefoot DPM

## 2015-05-24 ENCOUNTER — Encounter: Payer: Self-pay | Admitting: Podiatry

## 2015-05-24 ENCOUNTER — Ambulatory Visit (INDEPENDENT_AMBULATORY_CARE_PROVIDER_SITE_OTHER): Payer: BC Managed Care – PPO | Admitting: Podiatry

## 2015-05-24 VITALS — BP 111/53 | HR 58 | Resp 12

## 2015-05-24 DIAGNOSIS — L081 Erythrasma: Secondary | ICD-10-CM | POA: Diagnosis not present

## 2015-05-24 DIAGNOSIS — B353 Tinea pedis: Secondary | ICD-10-CM | POA: Diagnosis not present

## 2015-05-24 MED ORDER — ERYTHROMYCIN BASE 500 MG PO TABS: 500.0000 mg | ORAL_TABLET | Freq: Two times a day (BID) | ORAL | Status: DC

## 2015-05-24 MED ORDER — GENTIAN VIOLET 2 % EX SOLN
0.5000 mL | Freq: Two times a day (BID) | CUTANEOUS | Status: DC
Start: 2015-05-24 — End: 2016-09-30

## 2015-05-24 NOTE — Progress Notes (Signed)
Subjective:     Patient ID: Karina Small, female   DOB: 1951-01-21, 64 y.o.   MRN: [id number]  HPI this patient presents to the office with continued pain, discomfort between the fourth and fifth toes of her left foot. She has been treated initially with erythromycin and Lamisil which has cleared up the infection, between her toes. She says this past Sunday. the toe broke  down again and started draining itching and burning. She returns the office today for continued evaluation and treatment of this condition. She was diagnosed with coronary back 2 mm trapezium as well as possible tinea pedis   Review of Systems     Objective:   Physical Exam GENERAL APPEARANCE: Alert, conversant. Appropriately groomed. No acute distress.  VASCULAR: Pedal pulses are  palpable at  [facility] and PT bilateral.  Capillary refill time is immediate to all digits,  Normal temperature gradient.  Digital hair growth is present bilateral  NEUROLOGIC: sensation is normal to 5.07 monofilament at 5/5 sites bilateral.  Light touch is intact bilateral, Muscle strength normal.  MUSCULOSKELETAL: acceptable muscle strength, tone and stability bilateral.  Intrinsic muscluature intact bilateral.  Rectus appearance of foot and digits noted bilateral.   DERMATOLOGIC: skin color, texture, and turgor are within normal limits.  No preulcerative lesions or ulcers  are seen, no interdigital maceration noted.  No open lesions present.  Digital nails are asymptomatic.  Red inflamed drainage noted fourth interspace left foot.      Assessment:     Skin infection/ fungal interspace.     Plan:    ROV  E-mycin and prescribed gentian violet.  RTC 2 weeks for continued evaluation and treatment.   Gardiner Barefoot DPM

## 2015-05-31 ENCOUNTER — Encounter: Payer: Self-pay | Admitting: Gastroenterology

## 2016-05-23 LAB — HM PAP SMEAR: HM Pap smear: NEGATIVE

## 2016-07-27 ENCOUNTER — Other Ambulatory Visit: Payer: Self-pay

## 2016-07-27 ENCOUNTER — Ambulatory Visit (HOSPITAL_COMMUNITY)
Admission: EM | Admit: 2016-07-27 | Discharge: 2016-07-27 | Disposition: A | Discharge status: Home / Self Care | Payer: BC Managed Care – PPO | Attending: Family Medicine | Admitting: Family Medicine

## 2016-07-27 ENCOUNTER — Encounter (HOSPITAL_COMMUNITY): Payer: Self-pay

## 2016-07-27 DIAGNOSIS — J209 Acute bronchitis, unspecified: Secondary | ICD-10-CM | POA: Diagnosis not present

## 2016-07-27 MED ORDER — HYDROCOD POLST-CPM POLST ER 10-8 MG/5ML PO SUER: 5.0000 mL | Freq: Two times a day (BID) | ORAL | 0 refills | Status: AC | PRN

## 2016-07-27 NOTE — ED Provider Notes (Signed)
CSN: [id number]     Arrival date & time 07/27/16  1157 History   First MD Initiated Contact with Patient 07/27/16 1218     Chief Complaint  Patient presents with  . Cough   (Consider location/radiation/quality/duration/timing/severity/associated sxs/prior Treatment) Patient is a fairly healthy 66 y.o. Female, suddenly started experiencing chest tightness at rest 3 days ago proceeded by productive cough with yellow sputum,some shortness of breath, nasal congestion and running nose. Cough later became barky.  She called her allergist and was given Proair, which haven't been helping. She denies wheezing. She denies actual chest pain. She denies ear pain, sneezing, sinus pain, nausea, vomiting.        Past Medical History:  Diagnosis Date  . Environmental allergies   . GERD (gastroesophageal reflux disease)   . Osteoporosis   . PONV (postoperative nausea and vomiting)    Past Surgical History:  Procedure Laterality Date  . ROOT CANAL     Family History  Problem Relation Age of Onset  . Heart attack Father   . Breast cancer Mother   . Osteoporosis Mother    Social History  Substance Use Topics  . Smoking status: Never Smoker  . Smokeless tobacco: Never Used  . Alcohol use No   OB History    Gravida Para Term Preterm AB Living   0 0 0 0 0 0   SAB TAB Ectopic Multiple Live Births   0 0 0 0       Review of Systems  Constitutional: Negative for chills, fatigue and fever.  HENT: Positive for congestion and rhinorrhea. Negative for ear pain, sinus pain, sinus pressure, sneezing and sore throat.   Respiratory: Positive for cough and shortness of breath. Negative for wheezing.   Cardiovascular: Negative for chest pain and palpitations.  Gastrointestinal: Negative for abdominal pain, diarrhea and nausea.  Skin: Negative for rash.  Neurological: Positive for headaches. Negative for dizziness.    Allergies  Prednisone and Risedronate sodium  Home Medications   Prior to  Admission medications   Medication Sig Start Date End Date Taking? Authorizing Provider  Calcium Carbonate-Vitamin D (LIQUID CALCIUM/VITAMIN D PO) Take by mouth 2 (two) times daily.   Yes [provider]  cyclobenzaprine (FLEXERIL) 10 MG tablet Take 1 tablet (10 mg total) by mouth 3 (three) times daily as needed for muscle spasms. 09/18/12  Yes Waldemar Dickens, MD  erythromycin base (E-MYCIN) 500 MG tablet Take 1 tablet (500 mg total) by mouth 2 (two) times daily. 05/24/15  Yes Gardiner Barefoot, DPM  gentian violet 2 % topical solution Use as directed 0.5 mLs in the mouth or throat 2 (two) times daily. Apply to affected skin twice daily 05/24/15  Yes Gardiner Barefoot, DPM  ibuprofen (ADVIL,MOTRIN) 600 MG tablet Take 1 tablet (600 mg total) by mouth every 6 (six) hours as needed for pain. 09/18/12  Yes Waldemar Dickens, MD  ipratropium (ATROVENT) 0.06 % nasal spray Place 2 sprays into both nostrils 4 (four) times daily. 02/05/13  Yes Mabe, Shanon Brow, NP  loratadine (CLARITIN) 10 MG tablet Take 10 mg by mouth as needed.   Yes [provider]  ranitidine (ZANTAC) 150 MG tablet Take 150 mg by mouth as needed.   Yes [provider]  zoledronic acid (RECLAST) 5 MG/100ML SOLN Inject 5 mg into the vein once.   Yes [provider]  chlorpheniramine-HYDROcodone (TUSSIONEX PENNKINETIC ER) 10-8 MG/5ML SUER Take 5 mLs by mouth every 12 (twelve) hours as needed for cough. 07/27/16  08/03/16  Barry Dienes, NP   Meds Ordered and Administered this Visit  Medications - No data to display  BP (!) 144/70 (BP Location: Right Arm)   Pulse 84   Temp 99.4 F (37.4 C) (Oral)   Resp 20   LMP 04/04/1982   SpO2 100%  No data found.   Physical Exam  Constitutional: She is oriented to person, place, and time. She appears well-developed and well-nourished.  HENT:  Head: Normocephalic and atraumatic.  Right Ear: External ear normal.  Left Ear: External ear normal.  Nose: Nose normal.   Mouth/Throat: Oropharynx is clear and moist. No oropharyngeal exudate.  TM pearly gray with no erythema   Eyes: Conjunctivae and EOM are normal. Pupils are equal, round, and reactive to light.  Neck: Normal range of motion. Neck supple.  Cardiovascular:  S1 S2, normal rate, irregular rhythm, no murmur, chest wall tender to palpate  Pulmonary/Chest: Effort normal and breath sounds normal. She has no wheezes. She exhibits tenderness.  Abdominal: Soft. Bowel sounds are normal. She exhibits no mass. There is no tenderness.  Lymphadenopathy:    She has no cervical adenopathy.  Neurological: She is alert and oriented to person, place, and time.  Skin: Skin is warm and dry. No rash noted.  Psychiatric: She has a normal mood and affect.  Nursing note and vitals reviewed.   Urgent Care Course     Procedures (including critical care time)  Labs Review Labs Reviewed - No data to display  Imaging Review No results found.  MDM   1. Acute bronchitis, unspecified organism    EKG shows sinus rhythm with PAC but otherwise no evidence of heart block or conduction disturbance, no bundle branch block, no pathological Q wave, no ST segment deviation, no T wave inversion.  Patient appears well clinically. She is safe to be discharged home. Clinical symptoms most consistent with acute bronchitis. Rx for Tussionex given for cough.  Please f/u with PCP for no improvement.  ER precaution discussed.     Barry Dienes, NP 07/27/16 1309

## 2016-07-27 NOTE — ED Triage Notes (Signed)
Pt having productive cough with yellow mucus and said she is also having tightness in her chest as well since wed. Has been taking proair and mucus relief otc.

## 2016-09-10 ENCOUNTER — Encounter (HOSPITAL_COMMUNITY): Payer: Self-pay | Admitting: Emergency Medicine

## 2016-09-10 ENCOUNTER — Ambulatory Visit (HOSPITAL_COMMUNITY)
Admission: EM | Admit: 2016-09-10 | Discharge: 2016-09-10 | Disposition: A | Discharge status: Home / Self Care | Payer: BC Managed Care – PPO | Attending: Emergency Medicine | Admitting: Emergency Medicine

## 2016-09-10 DIAGNOSIS — G44219 Episodic tension-type headache, not intractable: Secondary | ICD-10-CM

## 2016-09-10 DIAGNOSIS — J320 Chronic maxillary sinusitis: Secondary | ICD-10-CM

## 2016-09-10 MED ORDER — AZITHROMYCIN 250 MG PO TABS: 250.0000 mg | ORAL_TABLET | Freq: Every day | ORAL | 0 refills | Status: DC

## 2016-09-10 MED ORDER — IPRATROPIUM BROMIDE 0.06 % NA SOLN: 2.0000 | Freq: Four times a day (QID) | NASAL | 0 refills | Status: DC

## 2016-09-10 NOTE — ED Triage Notes (Signed)
Pt reports hitting her head on Saturday on the corner of the back of a dining room chair.  Pt had a bump on her head after she hit it, but that has subsided and she has a small bruise there now.  She reports starting to feel nauseated and developed a headache yesterday.  She states she sometimes feels this way when her allergies are bothering her.

## 2016-09-10 NOTE — ED Provider Notes (Signed)
CSN: [id number]     Arrival date & time 09/10/16  1132 History   None    Chief Complaint  Patient presents with  . Headache  . bruise    right forehead  . Nausea   (Consider location/radiation/quality/duration/timing/severity/associated sxs/prior Treatment) Patient c/o hitting the edge of a chair with her left head frontal area and now has bruising.  She has been having headaches and sinus pain.  She is having URI sx's.   The history is provided by the patient.  Headache  Pain location:  Frontal Quality:  Dull Radiates to:  Does not radiate Severity currently:  5/10 Severity at highest:  5/10 Onset quality:  Sudden Duration:  2 days Timing:  Constant Chronicity:  New Associated symptoms: congestion and drainage     Past Medical History:  Diagnosis Date  . Environmental allergies   . GERD (gastroesophageal reflux disease)   . Osteoporosis   . PONV (postoperative nausea and vomiting)    Past Surgical History:  Procedure Laterality Date  . ROOT CANAL     Family History  Problem Relation Age of Onset  . Heart attack Father   . Breast cancer Mother   . Osteoporosis Mother    Social History  Substance Use Topics  . Smoking status: Never Smoker  . Smokeless tobacco: Never Used  . Alcohol use No   OB History    Gravida Para Term Preterm AB Living   0 0 0 0 0 0   SAB TAB Ectopic Multiple Live Births   0 0 0 0       Review of Systems  Constitutional: Negative.   HENT: Positive for congestion and postnasal drip.   Eyes: Negative.   Respiratory: Negative.   Cardiovascular: Negative.   Gastrointestinal: Negative.   Endocrine: Negative.   Genitourinary: Negative.   Musculoskeletal: Negative.   Allergic/Immunologic: Negative.   Neurological: Positive for headaches.  Hematological: Negative.   Psychiatric/Behavioral: Negative.     Allergies  Prednisone and Risedronate sodium  Home Medications   Prior to Admission medications   Medication Sig Start Date  End Date Taking? Authorizing Provider  aspirin 81 MG chewable tablet Chew by mouth daily.   Yes [provider]  ranitidine (ZANTAC) 150 MG tablet Take 150 mg by mouth as needed.   Yes [provider]  azithromycin (ZITHROMAX) 250 MG tablet Take 1 tablet (250 mg total) by mouth daily. Take first 2 tablets together, then 1 every day until finished. 09/10/16   Lysbeth Penner, FNP  Calcium Carbonate-Vitamin D (LIQUID CALCIUM/VITAMIN D PO) Take by mouth 2 (two) times daily.    [provider]  cyclobenzaprine (FLEXERIL) 10 MG tablet Take 1 tablet (10 mg total) by mouth 3 (three) times daily as needed for muscle spasms. 09/18/12   Waldemar Dickens, MD  erythromycin base (E-MYCIN) 500 MG tablet Take 1 tablet (500 mg total) by mouth 2 (two) times daily. 05/24/15   Gardiner Barefoot, DPM  gentian violet 2 % topical solution Use as directed 0.5 mLs in the mouth or throat 2 (two) times daily. Apply to affected skin twice daily 05/24/15   Gardiner Barefoot, DPM  ibuprofen (ADVIL,MOTRIN) 600 MG tablet Take 1 tablet (600 mg total) by mouth every 6 (six) hours as needed for pain. 09/18/12   Waldemar Dickens, MD  ipratropium (ATROVENT) 0.06 % nasal spray Place 2 sprays into both nostrils 4 (four) times daily. 02/05/13   Janne Napoleon, NP  ipratropium (ATROVENT) 0.06 % nasal  spray Place 2 sprays into both nostrils 4 (four) times daily. 09/10/16   Lysbeth Penner, FNP  loratadine (CLARITIN) 10 MG tablet Take 10 mg by mouth as needed.    [provider]  zoledronic acid (RECLAST) 5 MG/100ML SOLN Inject 5 mg into the vein once.    [provider]   Meds Ordered and Administered this Visit  Medications - No data to display  BP 114/60 (BP Location: Left Arm)   Pulse 88   Temp 98.4 F (36.9 C) (Oral)   LMP 04/04/1982   SpO2 100%  No data found.   Physical Exam  Constitutional: She is oriented to person, place, and time. She appears well-developed and well-nourished.  HENT:   Head: Normocephalic and atraumatic.  Eyes: Pupils are equal, round, and reactive to light. Conjunctivae and EOM are normal.  Neck: Normal range of motion.  Cardiovascular: Normal rate, regular rhythm and normal heart sounds.   Pulmonary/Chest: Effort normal and breath sounds normal.  Abdominal: Soft. Bowel sounds are normal.  Neurological: She is alert and oriented to person, place, and time.  Nursing note and vitals reviewed.   Urgent Care Course     Procedures (including critical care time)  Labs Review Labs Reviewed - No data to display  Imaging Review No results found.   Visual Acuity Review  Right Eye Distance:   Left Eye Distance:   Bilateral Distance:    Right Eye Near:   Left Eye Near:    Bilateral Near:         MDM   1. Episodic tension-type headache, not intractable   2. Chronic maxillary sinusitis    Zpak as directed Ipratropium nasal spray  Push po fluids, rest, tylenol and motrin otc prn as directed for fever, arthralgias, and myalgias.  Follow up prn if sx's continue or persist.    Lysbeth Penner, FNP 09/10/16 1224

## 2016-09-30 ENCOUNTER — Encounter (HOSPITAL_COMMUNITY): Payer: Self-pay | Admitting: Emergency Medicine

## 2016-09-30 ENCOUNTER — Ambulatory Visit (HOSPITAL_COMMUNITY)
Admission: EM | Admit: 2016-09-30 | Discharge: 2016-09-30 | Disposition: A | Discharge status: Home / Self Care | Payer: BC Managed Care – PPO | Attending: Family Medicine | Admitting: Family Medicine

## 2016-09-30 DIAGNOSIS — R21 Rash and other nonspecific skin eruption: Secondary | ICD-10-CM | POA: Diagnosis not present

## 2016-09-30 DIAGNOSIS — J0100 Acute maxillary sinusitis, unspecified: Secondary | ICD-10-CM | POA: Diagnosis not present

## 2016-09-30 MED ORDER — IPRATROPIUM BROMIDE 0.06 % NA SOLN: 2.0000 | Freq: Four times a day (QID) | NASAL | 12 refills | Status: DC

## 2016-09-30 NOTE — ED Triage Notes (Signed)
Patient complains of uri symptoms, sinus drainage, sore throat  Patient has rash/insect bites on bilateral  ankles, left arm, right arm.  Marland Kitchen

## 2016-09-30 NOTE — ED Provider Notes (Signed)
[facility]    CSN: [id number] Arrival date & time: 09/30/16  1222     History   Chief Complaint Chief Complaint  Patient presents with  . Sore Throat  . Rash    HPI Karina Small is a 66 y.o. female.   66 year old female comes in for sore throat and rash. She states she was seen 2 days ago with primary care for possible tick bites, and was put on doxycycline. She has now noticed multiple rashes throughout her extremities, that can be itchy in nature. Denies swelling of the throat, trouble breathing, trouble swallowing. She has also noticed sore throat and nasal congestion, with associated nausea. She was unsure if the symptoms are caused by the initiation of doxycycline. She has been taking over-the-counter allergy medicine as directed. Denies fever, chills, night sweats. Denies cough, ear pain, eye pain. Denies abdominal pain, vomiting, diarrhea, constipation.      Past Medical History:  Diagnosis Date  . Environmental allergies   . GERD (gastroesophageal reflux disease)   . Osteoporosis   . PONV (postoperative nausea and vomiting)     Patient Active Problem List   Diagnosis Date Noted  . Breast mass 03/16/2012  . COLONIC POLYPS 06/27/2007  . ALLERGIC RHINITIS 06/27/2007  . ESOPHAGITIS 06/27/2007  . GERD 06/27/2007  . HIATAL HERNIA 06/27/2007    Past Surgical History:  Procedure Laterality Date  . ROOT CANAL      OB History    Gravida Para Term Preterm AB Living   0 0 0 0 0 0   SAB TAB Ectopic Multiple Live Births   0 0 0 0         Home Medications    Prior to Admission medications   Medication Sig Start Date End Date Taking? Authorizing Provider  doxycycline (DORYX) 100 MG EC tablet Take 100 mg by mouth 2 (two) times daily.   Yes [provider]  aspirin 81 MG chewable tablet Chew by mouth daily.    [provider]  Calcium Carbonate-Vitamin D (LIQUID CALCIUM/VITAMIN D PO) Take by mouth 2 (two) times daily.     [provider]  ibuprofen (ADVIL,MOTRIN) 600 MG tablet Take 1 tablet (600 mg total) by mouth every 6 (six) hours as needed for pain. 09/18/12   Waldemar Dickens, MD  ipratropium (ATROVENT) 0.06 % nasal spray Place 2 sprays into both nostrils 4 (four) times daily. 09/30/16   Tasia Catchings, Amy V, PA-C  loratadine (CLARITIN) 10 MG tablet Take 10 mg by mouth as needed.    [provider]  zoledronic acid (RECLAST) 5 MG/100ML SOLN Inject 5 mg into the vein once.    [provider]    Family History Family History  Problem Relation Age of Onset  . Heart attack Father   . Breast cancer Mother   . Osteoporosis Mother     Social History Social History  Substance Use Topics  . Smoking status: Never Smoker  . Smokeless tobacco: Never Used  . Alcohol use No     Allergies   Prednisone and Risedronate sodium   Review of Systems Review of Systems  Reason unable to perform ROS: See HPI as above.     Physical Exam Triage Vital Signs ED Triage Vitals  Enc Vitals Group     BP 09/30/16 1336 (!) 111/48     Pulse Rate 09/30/16 1336 68     Resp 09/30/16 1336 18     Temp 09/30/16 1336 98 F (  36.7 C)     Temp Source 09/30/16 1336 Oral     SpO2 09/30/16 1336 93 %     Weight --      Height --      Head Circumference --      Peak Flow --      Pain Score 09/30/16 1333 1     Pain Loc --      Pain Edu? --      Excl. in [place]? --    No data found.   Updated Vital Signs BP (!) 111/48 (BP Location: Left Arm)   Pulse 68   Temp 98 F (36.7 C) (Oral)   Resp 18   LMP 04/04/1982   SpO2 93%   Visual Acuity Right Eye Distance:   Left Eye Distance:   Bilateral Distance:    Right Eye Near:   Left Eye Near:    Bilateral Near:     Physical Exam  Constitutional: She is oriented to person, place, and time. She appears well-developed and well-nourished. No distress.  HENT:  Head: Normocephalic and atraumatic.  Right Ear: External ear normal.  Left Ear: External ear normal.   Nose: Right sinus exhibits maxillary sinus tenderness. Right sinus exhibits no frontal sinus tenderness. Left sinus exhibits maxillary sinus tenderness. Left sinus exhibits no frontal sinus tenderness.  Mouth/Throat: Uvula is midline, oropharynx is clear and moist and mucous membranes are normal.  Bilateral cerumen impaction, tympanic membrane not visible.  Eyes: Pupils are equal, round, and reactive to light. Conjunctivae are normal.  Neck: Normal range of motion. Neck supple.  Cardiovascular: Normal rate, regular rhythm and normal heart sounds.  Exam reveals no gallop and no friction rub.   No murmur heard. Pulmonary/Chest: Effort normal and breath sounds normal. She has no decreased breath sounds. She has no wheezes. She has no rhonchi. She has no rales.  Lymphadenopathy:    She has no cervical adenopathy.  Neurological: She is alert and oriented to person, place, and time.  Skin: Skin is warm and dry.  Multiple macular rashes on bilateral extremity. No increased warmth, pain on palpation.   Psychiatric: She has a normal mood and affect. Her behavior is normal. Judgment normal.     UC Treatments / Results  Labs (all labs ordered are listed, but only abnormal results are displayed) Labs Reviewed - No data to display  EKG  EKG Interpretation None       Radiology No results found.  Procedures Procedures (including critical care time)  Medications Ordered in UC Medications - No data to display   Initial Impression / Assessment and Plan / UC Course  I have reviewed the triage vital signs and the nursing notes.  Pertinent labs & imaging results that were available during my care of the patient were reviewed by me and considered in my medical decision making (see chart for details).    Discussed with patient rash inconsistent with urticaria, drug induced rash. Patient to monitor for any possible exposure to bugs at home, with pets, environment that could be causing the  rash. Continue doxycycline as directed. Use nasal spray for nasal congestion, continue allergy medicine. Use over-the-counter decongestants nasal sprays for nasal congestion. Return precautions given.  Final Clinical Impressions(s) / UC Diagnoses   Final diagnoses:  Rash  Acute non-recurrent maxillary sinusitis    New Prescriptions Discharge Medication List as of 09/30/2016  2:30 PM       Ok Edwards, PA-C 09/30/16 1504

## 2016-09-30 NOTE — Discharge Instructions (Signed)
Continue doxycycline as directed. Take over-the-counter antihistamine such as Zyrtec, Claritin for allergies. Use nasal spray for nasal congestion. Over the counter decongestant, nasal saline rinse such as neti pot, for nasal congestion. Monitor for bedbugs, and insects in home, fleas on pets that could be causing the rashes. If experiencing spreading redness, increased warmth, increased pain, fever, follow-up for reevaluation. If experiencing shortness of breath, trouble breathing, trouble swallowing, swelling of the throat, go to the emergency department for further evaluation.

## 2018-07-06 ENCOUNTER — Encounter: Payer: Self-pay | Admitting: Gastroenterology

## 2018-11-25 ENCOUNTER — Other Ambulatory Visit: Payer: Self-pay

## 2018-11-25 DIAGNOSIS — Z20822 Contact with and (suspected) exposure to covid-19: Secondary | ICD-10-CM

## 2018-11-26 LAB — NOVEL CORONAVIRUS, NAA: SARS-CoV-2, NAA: NOT DETECTED

## 2019-01-19 ENCOUNTER — Other Ambulatory Visit: Payer: Self-pay

## 2019-01-19 DIAGNOSIS — Z20822 Contact with and (suspected) exposure to covid-19: Secondary | ICD-10-CM

## 2019-01-20 LAB — NOVEL CORONAVIRUS, NAA: SARS-CoV-2, NAA: NOT DETECTED

## 2019-01-26 LAB — HM MAMMOGRAPHY

## 2019-02-15 ENCOUNTER — Ambulatory Visit: Payer: BC Managed Care – PPO | Attending: Internal Medicine

## 2019-02-15 DIAGNOSIS — Z20822 Contact with and (suspected) exposure to covid-19: Secondary | ICD-10-CM

## 2019-02-16 LAB — NOVEL CORONAVIRUS, NAA: SARS-CoV-2, NAA: NOT DETECTED

## 2019-02-17 ENCOUNTER — Telehealth: Payer: Self-pay | Admitting: Hematology

## 2019-02-17 NOTE — Telephone Encounter (Signed)
Pt is aware covid 19 result is neg on 02-17-2019

## 2020-03-24 ENCOUNTER — Encounter: Payer: Self-pay | Admitting: Medical

## 2020-03-24 ENCOUNTER — Other Ambulatory Visit: Payer: Self-pay

## 2020-03-24 ENCOUNTER — Ambulatory Visit (INDEPENDENT_AMBULATORY_CARE_PROVIDER_SITE_OTHER): Payer: Medicare Other | Admitting: Medical

## 2020-03-24 VITALS — BP 138/66 | HR 83 | Ht 62.0 in | Wt 117.0 lb

## 2020-03-24 DIAGNOSIS — R634 Abnormal weight loss: Secondary | ICD-10-CM | POA: Insufficient documentation

## 2020-03-24 DIAGNOSIS — R1310 Dysphagia, unspecified: Secondary | ICD-10-CM | POA: Insufficient documentation

## 2020-03-24 DIAGNOSIS — Z23 Encounter for immunization: Secondary | ICD-10-CM | POA: Diagnosis not present

## 2020-03-24 DIAGNOSIS — H9190 Unspecified hearing loss, unspecified ear: Secondary | ICD-10-CM

## 2020-03-24 DIAGNOSIS — R109 Unspecified abdominal pain: Secondary | ICD-10-CM | POA: Insufficient documentation

## 2020-03-24 DIAGNOSIS — R630 Anorexia: Secondary | ICD-10-CM | POA: Insufficient documentation

## 2020-03-24 DIAGNOSIS — H6123 Impacted cerumen, bilateral: Secondary | ICD-10-CM | POA: Insufficient documentation

## 2020-03-24 DIAGNOSIS — L853 Xerosis cutis: Secondary | ICD-10-CM | POA: Insufficient documentation

## 2020-03-24 DIAGNOSIS — G479 Sleep disorder, unspecified: Secondary | ICD-10-CM | POA: Insufficient documentation

## 2020-03-24 LAB — POCT URINALYSIS DIP (PROADVANTAGE DEVICE)
Bilirubin, UA: NEGATIVE
Blood, UA: NEGATIVE
Glucose, UA: NEGATIVE mg/dL
Ketones, POC UA: NEGATIVE mg/dL
Leukocytes, UA: NEGATIVE
Nitrite, UA: NEGATIVE
Protein Ur, POC: NEGATIVE mg/dL
Specific Gravity, Urine: 1.025
Urobilinogen, Ur: 0.2
pH, UA: 6 (ref 5.0–8.0)

## 2020-03-24 LAB — CBC WITH DIFFERENTIAL/PLATELET
Monocytes Absolute: 0.3 10*3/uL (ref 0.1–0.9)
WBC: 3.9 10*3/uL (ref 3.4–10.8)

## 2020-03-24 LAB — COMPREHENSIVE METABOLIC PANEL

## 2020-03-24 MED ORDER — HYDROCORTISONE 2.5 % EX OINT: TOPICAL_OINTMENT | Freq: Two times a day (BID) | CUTANEOUS | 0 refills | Status: DC

## 2020-03-24 MED ORDER — DEBROX 6.5 % OT SOLN: 5.0000 [drp] | Freq: Two times a day (BID) | OTIC | 0 refills | Status: DC

## 2020-03-24 NOTE — Progress Notes (Signed)
Subjective:  Karina Small is a 70 y.o. female who presents for Chief Complaint  Patient presents with  . New Patient (Initial Visit)    Have a list of concerns including some gastro issues and fatigue.      Here as a new patient.  Wanted to try out new PCP.  Has a list of concerns.  Medical team: Dr. Harold Hedge, allergist Has GI but been years since last visit, due for colonoscopy Was seeing Dr. Raphael Gibney but he retired Sees dentist Sees eye doctor  She is interested in flu and pneumonica vaccine.    She has concerns about swallowing issues, feels like spit even gets stuck in throat.   concerned about dry skin on right hand and left hand.  Uses moisturizing lotion.  Some times worse flare up on hands.    Not sleeping well, only sleeps a few hours per night.  Has never had good, but worse of late.   Had sleep study long time ago.    Taking Miralax and probiotic.  When she started this a month ago was having good BMs, but backed off given too loose stools.  Since backing off miralax some , back to alternating between turd balls and loose stools.  Has had bowel issues a long time but worse of late.  Has had to do some water enemies.    Has lost some weight in recent month, at least 10 pounds.  Thinks its related to her stomach issues.   Does get some stomach aches, lack of appetite.  Gets thirsty often too.    Not currently taking any medication  Has not had any labs in recent years.  No other aggravating or relieving factors.    No other c/o.  Past Medical History:  Diagnosis Date  . Environmental allergies   . GERD (gastroesophageal reflux disease)   . Osteoporosis   . PONV (postoperative nausea and vomiting)     The following portions of the patient's history were reviewed and updated as appropriate: allergies, current medications, past family history, past medical history, past social history, past surgical history and problem list.  ROS Otherwise as in  subjective above  Objective: BP 138/66   Pulse 83   Ht 5\' 2"  (1.575 m)   Wt 117 lb (53.1 kg)   LMP 04/04/1982   SpO2 99%   BMI 21.40 kg/m   General appearance: alert, no distress, well developed, well nourished, lean African American female HEENT: normocephalic, sclerae anicteric, conjunctiva pink and moist, bilat ear canals small with impacted cerumen, nares patent, no discharge or erythema, pharynx normal Oral cavity: MMM, no lesions Neck: supple, no lymphadenopathy, no thyromegaly, no masses Heart: RRR, normal S1, S2, no murmurs Lungs: CTA bilaterally, no wheezes, rhonchi, or rales Abdomen: +bs, soft, non tender, non distended, no masses, no hepatomegaly, no splenomegaly Pulses: 2+ radial pulses, 2+ pedal pulses, normal cap refill Ext: no edema Mild dry rough skin on bilat hands between thumbs and 2nd fingers   Assessment: Encounter Diagnoses  Name Primary?  . Appetite loss Yes  . Weight loss   . Abdominal discomfort   . Hearing loss, unspecified hearing loss type, unspecified laterality   . Dry skin dermatitis   . Dysphagia, unspecified type   . Sleep disturbance   . Bilateral impacted cerumen   . Need for influenza vaccination   . Need for pneumococcal vaccination      Plan: She has a new patient today with a whole list of  concerns  I tried to address most of them  Referral to GI given the multiple GI symptoms and recent weight loss reported by her.  Labs today.  We discussed the following recommendations   Recommendations We will check some labs today given your symptoms  We will refer you to gastroenterology given your several symptoms  Begin Hydrocortisone ointment on the dry rough parts of your hands for a few days at a time.   otherwise continue a daily moisturizing lotion such as Lubriderm or Aquaphor  Drink 80-100 ounces of water daily.  Begin trial of benadryl 25 mg tablet over the counter at bedtime for the next few days to see if that helps  with sleep   Begin 1/2 cap full of Miralax either daily or every other day to help with bowels.  We updated Pneumooccal 23 and flu shot today  Begin Debrox ear drops 3 days per week and lets recheck your ears in a months.  In the shower, let the shower water flush your ears.  Use a few drops of rubbing alcohol in each ear after shower/bath.    Vaccine documentation: Counseled on the influenza virus vaccine.  Vaccine information sheet given.   High dose Influenza vaccine given after consent obtained.  Counseled on the pneumococcal vaccine.  Vaccine information sheet given.  Pneumococcal vaccine PPSV23 given after consent obtained.  [name] was seen today for new patient (initial visit).  Diagnoses and all orders for this visit:  Appetite loss  Weight loss -     Comprehensive metabolic panel -     CBC with Differential/Platelet -     Lipase -     TSH -     Ambulatory referral to Gastroenterology -     POCT Urinalysis DIP (Proadvantage Device)  Abdominal discomfort -     Comprehensive metabolic panel -     CBC with Differential/Platelet -     Lipase -     Ambulatory referral to Gastroenterology -     POCT Urinalysis DIP (Proadvantage Device)  Hearing loss, unspecified hearing loss type, unspecified laterality  Dry skin dermatitis  Dysphagia, unspecified type -     Comprehensive metabolic panel -     CBC with Differential/Platelet -     Lipase -     Ambulatory referral to Gastroenterology  Sleep disturbance  Bilateral impacted cerumen  Need for influenza vaccination -     Flu Vaccine QUAD High Dose(Fluad)  Need for pneumococcal vaccination  Other orders -     carbamide peroxide (DEBROX) 6.5 % OTIC solution; Place 5 drops into both ears 2 (two) times daily. -     hydrocortisone 2.5 % ointment; Apply topically 2 (two) times daily. -     Pneumococcal polysaccharide vaccine 23-valent greater than or equal to 2yo subcutaneous/IM    Follow up: pending labs,  referral

## 2020-03-24 NOTE — Patient Instructions (Addendum)
Encounter Diagnoses  Name Primary?  . Appetite loss Yes  . Weight loss   . Abdominal discomfort   . Hearing loss, unspecified hearing loss type, unspecified laterality   . Dry skin dermatitis   . Dysphagia, unspecified type   . Sleep disturbance   . Bilateral impacted cerumen   . Need for influenza vaccination   . Need for pneumococcal vaccination     Recommendations We will check some labs today given your symptoms  We will refer you to gastroenterology given your several symptoms  Begin Hydrocortisone ointment on the dry rough parts of your hands for a few days at a time.   otherwise continue a daily moisturizing lotion such as Lubriderm or Aquaphor  Drink 80-100 ounces of water daily.  Begin trial of benadryl 25 mg tablet over the counter at bedtime for the next few days to see if that helps with sleep   Begin 1/2 cap full of Miralax either daily or every other day to help with bowels.  We updated Pneumooccal 23 and flu shot today  Begin Debrox ear drops 3 days per week and lets recheck your ears in a months.  In the shower, let the shower water flush your ears.  Use a few drops of rubbing alcohol in each ear after shower/bath.

## 2020-03-24 NOTE — Progress Notes (Signed)
Patient has been referred to [facility]

## 2020-03-25 LAB — CBC WITH DIFFERENTIAL/PLATELET
Basophils Absolute: 0 10*3/uL (ref 0.0–0.2)
Basos: 1 %
EOS (ABSOLUTE): 0 10*3/uL (ref 0.0–0.4)
Eos: 1 %
Hematocrit: 35.7 % (ref 34.0–46.6)
Hemoglobin: 12 g/dL (ref 11.1–15.9)
Immature Grans (Abs): 0 10*3/uL (ref 0.0–0.1)
Immature Granulocytes: 0 %
Lymphocytes Absolute: 1.4 10*3/uL (ref 0.7–3.1)
Lymphs: 37 %
MCH: 30.2 pg (ref 26.6–33.0)
MCHC: 33.6 g/dL (ref 31.5–35.7)
MCV: 90 fL (ref 79–97)
Monocytes: 8 %
Neutrophils Absolute: 2.1 10*3/uL (ref 1.4–7.0)
Neutrophils: 53 %
Platelets: 259 10*3/uL (ref 150–450)
RBC: 3.98 x10E6/uL (ref 3.77–5.28)
RDW: 11.8 % (ref 11.7–15.4)

## 2020-03-25 LAB — COMPREHENSIVE METABOLIC PANEL
ALT: 13 IU/L (ref 0–32)
AST: 15 IU/L (ref 0–40)
Albumin/Globulin Ratio: 1.8 (ref 1.2–2.2)
Alkaline Phosphatase: 51 IU/L (ref 44–121)
BUN/Creatinine Ratio: 26 (ref 12–28)
BUN: 15 mg/dL (ref 8–27)
Bilirubin Total: 0.5 mg/dL (ref 0.0–1.2)
CO2: 20 mmol/L (ref 20–29)
Calcium: 10 mg/dL (ref 8.7–10.3)
Chloride: 102 mmol/L (ref 96–106)
Creatinine, Ser: 0.58 mg/dL (ref 0.57–1.00)
GFR calc Af Amer: 110 mL/min/{1.73_m2} (ref >59)
GFR calc non Af Amer: 95 mL/min/{1.73_m2} (ref >59)
Globulin, Total: 2.6 g/dL (ref 1.5–4.5)
Potassium: 4.2 mmol/L (ref 3.5–5.2)
Sodium: 141 mmol/L (ref 134–144)
Total Protein: 7.2 g/dL (ref 6.0–8.5)

## 2020-03-25 LAB — TSH: TSH: 1.01 u[IU]/mL (ref 0.450–4.500)

## 2020-03-25 LAB — LIPASE: Lipase: 42 U/L (ref 14–72)

## 2020-04-10 ENCOUNTER — Encounter: Payer: Self-pay | Admitting: Medical

## 2020-04-10 ENCOUNTER — Ambulatory Visit (INDEPENDENT_AMBULATORY_CARE_PROVIDER_SITE_OTHER): Payer: BC Managed Care – PPO | Admitting: Medical

## 2020-04-10 ENCOUNTER — Other Ambulatory Visit: Payer: Self-pay

## 2020-04-10 VITALS — BP 110/68 | HR 83 | Ht 62.0 in | Wt 116.6 lb

## 2020-04-10 DIAGNOSIS — H61303 Acquired stenosis of external ear canal, unspecified, bilateral: Secondary | ICD-10-CM | POA: Insufficient documentation

## 2020-04-10 DIAGNOSIS — L853 Xerosis cutis: Secondary | ICD-10-CM

## 2020-04-10 DIAGNOSIS — H6123 Impacted cerumen, bilateral: Secondary | ICD-10-CM | POA: Diagnosis not present

## 2020-04-10 DIAGNOSIS — K219 Gastro-esophageal reflux disease without esophagitis: Secondary | ICD-10-CM

## 2020-04-10 DIAGNOSIS — R1310 Dysphagia, unspecified: Secondary | ICD-10-CM | POA: Diagnosis not present

## 2020-04-10 DIAGNOSIS — R109 Unspecified abdominal pain: Secondary | ICD-10-CM

## 2020-04-10 DIAGNOSIS — H9190 Unspecified hearing loss, unspecified ear: Secondary | ICD-10-CM | POA: Diagnosis not present

## 2020-04-10 DIAGNOSIS — R630 Anorexia: Secondary | ICD-10-CM

## 2020-04-10 NOTE — Progress Notes (Signed)
Subjective:  Karina Small is a 70 y.o. female who presents for Chief Complaint  Patient presents with  . Follow-up    Discuss recent labs      Here for follow-up.  I saw her on February 11 as a new patient.  On that visit she had a numerous list of concerns and complaints.  She is here to follow-up on those issues as well as review the labs done at the first visit  Medical team: Dr. Harold Hedge, allergist Has GI but been years since last visit, due for colonoscopy Was seeing Dr. Raphael Gibney but he retired Sees dentist Sees eye doctor  She has concerns about swallowing issues, feels like spit even gets stuck in throat.  The problems have not been as bad since last visit but she does have appointment with gastroenterology coming up soon  Last visit she was concerned about dry skin of both hands.  Since last visit used a steroid cream I gave her for flareup and using daily moisturizing lotion.  She does improvement  Has lost some weight in recent month, at least 10 pounds.  Thinks its related to her stomach issues.   Does get some stomach aches, lack of appetite.  Gets thirsty often too.    Last visit she was concerned about earwax.  She tried the Debrox drops at home but felt like she was getting off balance so she stopped using it  She has been up-to-date on cancer screenings.  She sees Dr. Leo Grosser and in recent years has had mammogram and Pap smear.  Her last colonoscopy about 10 years ago.  No other aggravating or relieving factors.    No other c/o.  Past Medical History:  Diagnosis Date  . Environmental allergies   . GERD (gastroesophageal reflux disease)   . Osteoporosis   . PONV (postoperative nausea and vomiting)     The following portions of the patient's history were reviewed and updated as appropriate: allergies, current medications, past family history, past medical history, past social history, past surgical history and problem list.  ROS Otherwise as in subjective  above  Objective: BP 110/68   Pulse 83   Ht 5\' 2"  (1.575 m)   Wt 116 lb 9.6 oz (52.9 kg)   LMP 04/04/1982   SpO2 97%   BMI 21.33 kg/m   Wt Readings from Last 3 Encounters:  04/10/20 116 lb 9.6 oz (52.9 kg)  03/24/20 117 lb (53.1 kg)  09/29/14 119 lb 0.1 oz (54 kg)    General appearance: alert, no distress, well developed, well nourished, lean African American female HEENT: normocephalic, sclerae anicteric, conjunctiva pink and moist, bilat ear canals small with moderate cerumen, nares patent, no discharge or erythema, pharynx normal Oral cavity: MMM, no lesions Neck: supple, no lymphadenopathy, no thyromegaly, no masses Heart: RRR, normal S1, S2, no murmurs Lungs: CTA bilaterally, no wheezes, rhonchi, or rales Pulses: 2+ radial pulses, 2+ pedal pulses, normal cap refill Ext: no edema Mild dry rough skin on bilat hands between thumbs and 2nd fingers   Assessment: Encounter Diagnoses  Name Primary?  . Hearing loss, unspecified hearing loss type, unspecified laterality Yes  . Dry skin dermatitis   . Dysphagia, unspecified type   . Bilateral impacted cerumen   . Abdominal discomfort   . Appetite loss   . Gastroesophageal reflux disease, unspecified whether esophagitis present   . 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  She will use the steroid cream for flareup only as discussed  Dysphagia, abdominal discomfort, appetite loss, GERD-continue plan to see gastroenterology from referral from last visit  We discussed her labs from last visit which were all normal fortunately  Weights are stable    Karina Small was seen today for follow-up.  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

## 2020-04-11 ENCOUNTER — Encounter: Payer: Self-pay | Admitting: Nurse Practitioner

## 2020-04-26 ENCOUNTER — Encounter: Payer: Self-pay | Admitting: Nurse Practitioner

## 2020-04-26 ENCOUNTER — Other Ambulatory Visit: Payer: Self-pay

## 2020-04-26 ENCOUNTER — Ambulatory Visit: Payer: Medicare Other | Admitting: Nurse Practitioner

## 2020-04-26 VITALS — BP 102/72 | HR 82 | Ht 62.0 in | Wt 117.6 lb

## 2020-04-26 DIAGNOSIS — R634 Abnormal weight loss: Secondary | ICD-10-CM

## 2020-04-26 DIAGNOSIS — R1010 Upper abdominal pain, unspecified: Secondary | ICD-10-CM

## 2020-04-26 DIAGNOSIS — R1310 Dysphagia, unspecified: Secondary | ICD-10-CM

## 2020-04-26 DIAGNOSIS — K5909 Other constipation: Secondary | ICD-10-CM | POA: Diagnosis not present

## 2020-04-26 DIAGNOSIS — Z1211 Encounter for screening for malignant neoplasm of colon: Secondary | ICD-10-CM

## 2020-04-26 MED ORDER — CLENPIQ 10-3.5-12 MG-GM -GM/160ML PO SOLN: 1.0000 | Freq: Once | ORAL | 0 refills | Status: AC

## 2020-04-26 NOTE — Progress Notes (Signed)
ASSESSMENT AND PLAN    # 70 year old female with one month history of dysphagia to both solids and liquids.  Also chokes on saliva at times.  Rule out esophageal dysmotility vrs stricture --Patient will be scheduled for EGD with possible dilation . The risks and benefits of EGD were discussed and the patient agrees to proceed.  --Advised patient to eat small bites, chew well with liquids in between bites to avoid food impaction.  # 20 pound weight loss since January and now with a one month history of intermittent,  nonradiating , diffuse upper abdominal pain unrelated to eating.  Recent labs including CBC, CMP, lipase were normal.  Etiology unclear.  Normal labs are reassuring. --Further evaluation at time of EGD --We talked about adding some Ensure 1-2 times a day to try and maintain nutrition  #Colon cancer screening.  Last colonoscopy 2020, due for another screening colonoscopy.  --Patient will be scheduled for a colonoscopy. The risks and benefits of colonoscopy with possible polypectomy / biopsies were discussed and the patient agrees to proceed.   # Acute on chronic constipation, she has taken Miralax as needed for years --Persistent constipation despite daily Miralax since January. Advised to increase dose to BID for now.  --Needs to increase water intake from 3 to 6 bottles  / day --Hold off on starting fiber supplements due to complaints of bloating but continuation of high fiber diet is fine.  --Further evaluation at time of colonoscopy.  If no improvement with above measures and colonoscopy negative then consider Amitiza    * Endoscopic procedures will be done by Dr. Ardis Hughs given his availability. Dr. Havery Moros is staffing MD but has no availability until May.    HISTORY OF PRESENT ILLNESS:     Chief Complaint : dysphagia, weight loss, upper abdominal pain, bloating, constipation.   JIAYI LENGACHER is a 70 y.o. female with chronic constipation. She was previously  followed by Dr. Olevia Perches.  She had a screening colonoscopy in 2010 at which time a large polyp was removed.  Path showed benign colorectal mucosa, subepithelial fibrosis.  Patient is referred by PCP for evaluation of dysphagia, abdominal pain and weight loss . She describes intermittent dysphagia to both solids and liquids over the last month. She also chokes on saliva at times.  Patient told years ago that she had acid reflux but she doesn't take medication for it on a regular basis. She gets heartburn  1-2 times a week.    Between January 2022 and now patient has lost 20 pounds. She attributes the weight loss to generalized upper abdominal discomfort she has been having since January. The discomfort is intermittent.  She cannot relate it to eating. It is not related to activity / movement. Nothing makes it worse or better. No associated nausea / vomiting. No significant NSAID use.   Jamyia complains of gas, bloating and worsening of her chronic constipation.  For years she has taken MiraLAX as needed. In January she became constipated, restarted daily Miralax but has had a hard time getting back on track.  She passes small, hard pieces of stool.  No blood in stool  Data Reviewed: 03/24/20 CBC, CMET, TSH normal.   PREVIOUS EVALUATIONS:    Past Medical History:  Diagnosis Date  . Environmental allergies   . GERD (gastroesophageal reflux disease)   . Osteoporosis   . PONV (postoperative nausea and vomiting)      Past Surgical History:  Procedure Laterality Date  . ROOT  CANAL     Family History  Problem Relation Age of Onset  . Heart attack Father   . Breast cancer Mother   . Osteoporosis Mother   . Colon cancer Neg Hx   . Stomach cancer Neg Hx   . Esophageal cancer Neg Hx   . Pancreatic cancer Neg Hx   . Liver disease Neg Hx    Social History   Tobacco Use  . Smoking status: Never Smoker  . Smokeless tobacco: Never Used  Vaping Use  . Vaping Use: Never used  Substance Use  Topics  . Alcohol use: No  . Drug use: No   Current Outpatient Medications  Medication Sig Dispense Refill  . hydrocortisone 2.5 % ointment Apply topically 2 (two) times daily. 30 g 0  . polyethylene glycol (MIRALAX / GLYCOLAX) 17 g packet Take 17 g by mouth daily.     No current facility-administered medications for this visit.   Allergies  Allergen Reactions  . Prednisone     REACTION: nausea  . Risedronate Sodium     REACTION: nausea, heartburn, chest pain     Review of Systems:  All systems reviewed and negative except where noted in HPI.   PHYSICAL EXAM :    Wt Readings from Last 3 Encounters:  04/26/20 117 lb 9.6 oz (53.3 kg)  04/10/20 116 lb 9.6 oz (52.9 kg)  03/24/20 117 lb (53.1 kg)    BP 102/72   Pulse 82   Ht 5\' 2"  (1.575 m)   Wt 117 lb 9.6 oz (53.3 kg)   LMP 04/04/1982   SpO2 99%   BMI 21.51 kg/m  Constitutional:  Pleasant thin female in no acute distress. Psychiatric: Normal mood and affect. Behavior is normal. EENT: Pupils normal.  Conjunctivae are normal. No scleral icterus. Neck supple.  Cardiovascular: Normal rate, regular rhythm. No edema Pulmonary/chest: Effort normal and breath sounds normal. No wheezing, rales or rhonchi. Abdominal: Soft, nondistended, nontender. Bowel sounds active throughout. There are no masses palpable. No hepatomegaly. Neurological: Alert and oriented to person place and time. Skin: Skin is warm and dry. No rashes noted.  Tye Savoy, NP  04/26/2020, 8:39 AM  Cc:  Referring Provider Tysinger, Camelia Eng, PA-C

## 2020-04-26 NOTE — Patient Instructions (Signed)
If you are age 68 or older, your body mass index should be between 23-30. Your Body mass index is 21.51 kg/m. If this is out of the aforementioned range listed, please consider follow up with your Primary Care Provider.  If you are age 42 or younger, your body mass index should be between 19-25. Your Body mass index is 21.51 kg/m. If this is out of the aformentioned range listed, please consider follow up with your Primary Care Provider.   Increase Miralax to twice daily.   Increase water intake to 6 bottles daily.   You have been scheduled for an endoscopy and colonoscopy. Please follow the written instructions given to you at your visit today. Please pick up your prep supplies at the pharmacy within the next 1-3 days. If you use inhalers (even only as needed), please bring them with you on the day of your procedure.  Due to recent changes in healthcare laws, you may see the results of your imaging and laboratory studies on MyChart before your provider has had a chance to review them.  We understand that in some cases there may be results that are confusing or concerning to you. Not all laboratory results come back in the same time frame and the provider may be waiting for multiple results in order to interpret others.  Please give [place] 48 hours in order for your provider to thoroughly review all the results before contacting the office for clarification of your results.

## 2020-04-29 NOTE — Progress Notes (Signed)
I agree with the above note, plan 

## 2020-05-24 ENCOUNTER — Other Ambulatory Visit: Payer: Self-pay

## 2020-05-24 ENCOUNTER — Encounter: Payer: Self-pay | Admitting: Gastroenterology

## 2020-05-24 ENCOUNTER — Ambulatory Visit (AMBULATORY_SURGERY_CENTER): Payer: Medicare Other | Admitting: Gastroenterology

## 2020-05-24 VITALS — BP 122/66 | HR 72 | Temp 97.9°F | Resp 13 | Ht 62.0 in | Wt 117.0 lb

## 2020-05-24 DIAGNOSIS — R1310 Dysphagia, unspecified: Secondary | ICD-10-CM

## 2020-05-24 DIAGNOSIS — K648 Other hemorrhoids: Secondary | ICD-10-CM

## 2020-05-24 DIAGNOSIS — R1010 Upper abdominal pain, unspecified: Secondary | ICD-10-CM

## 2020-05-24 DIAGNOSIS — K5909 Other constipation: Secondary | ICD-10-CM | POA: Diagnosis not present

## 2020-05-24 DIAGNOSIS — K635 Polyp of colon: Secondary | ICD-10-CM

## 2020-05-24 DIAGNOSIS — K31A0 Gastric intestinal metaplasia, unspecified: Secondary | ICD-10-CM | POA: Diagnosis not present

## 2020-05-24 DIAGNOSIS — K2970 Gastritis, unspecified, without bleeding: Secondary | ICD-10-CM

## 2020-05-24 DIAGNOSIS — D123 Benign neoplasm of transverse colon: Secondary | ICD-10-CM

## 2020-05-24 DIAGNOSIS — K319 Disease of stomach and duodenum, unspecified: Secondary | ICD-10-CM | POA: Diagnosis not present

## 2020-05-24 DIAGNOSIS — K2990 Gastroduodenitis, unspecified, without bleeding: Secondary | ICD-10-CM

## 2020-05-24 MED ORDER — OMEPRAZOLE 40 MG PO CPDR: 40.0000 mg | DELAYED_RELEASE_CAPSULE | Freq: Every day | ORAL | 5 refills | Status: DC

## 2020-05-24 MED ORDER — SODIUM CHLORIDE 0.9 % IV SOLN: 500.0000 mL | Freq: Once | INTRAVENOUS | Status: DC

## 2020-05-24 MED ORDER — LINACLOTIDE 72 MCG PO CAPS
72.0000 ug | ORAL_CAPSULE | Freq: Every day | ORAL | 5 refills | Status: AC
Start: 2020-05-24 — End: ?

## 2020-05-24 NOTE — Progress Notes (Signed)
PT taken to PACU. Monitors in place. VSS. Report given to RN. 

## 2020-05-24 NOTE — Op Note (Signed)
[place] Patient Name: Karina Small Procedure Date: 05/24/2020 9:54 AM MRN: [id number] Endoscopist: Milus Banister , MD Age: 70 Referring MD:  Date of Birth: [birth year] Gender: Female Account #: [id number] Procedure:                Colonoscopy Indications:              Screening for colorectal malignant neoplasm, also                            chronic constipation Medicines:                Monitored Anesthesia Care Procedure:                Pre-Anesthesia Assessment:                           - Prior to the procedure, a History and Physical                            was performed, and patient medications and                            allergies were reviewed. The patient's tolerance of                            previous anesthesia was also reviewed. The risks                            and benefits of the procedure and the sedation                            options and risks were discussed with the patient.                            All questions were answered, and informed consent                            was obtained. Prior Anticoagulants: The patient has                            taken no previous anticoagulant or antiplatelet                            agents. ASA Grade Assessment: II - A patient with                            mild systemic disease. After reviewing the risks                            and benefits, the patient was deemed in                            satisfactory condition to undergo the procedure.  After obtaining informed consent, the colonoscope                            was passed under direct vision. Throughout the                            procedure, the patient's blood pressure, pulse, and                            oxygen saturations were monitored continuously. The                            Olympus PCF-H190DL (#[id number]) Colonoscope was                            introduced through the anus and advanced  to the the                            cecum, identified by appendiceal orifice and                            ileocecal valve. The colonoscopy was performed                            without difficulty. The patient tolerated the                            procedure well. The quality of the bowel                            preparation was good. The ileocecal valve,                            appendiceal orifice, and rectum were photographed. 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                           A 4 mm polyp was found in the transverse colon. The                            polyp was sessile. The polyp was removed with a                            cold snare. Resection and retrieval were complete.                           External and internal hemorrhoids were found. The                            hemorrhoids were small.  The exam was otherwise without abnormality on                            direct and retroflexion views. Complications:            No immediate complications. Estimated blood loss:                            None. Estimated Blood Loss:     Estimated blood loss: none. 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                           - One 4 mm polyp in the transverse colon, removed                            with a cold snare. Resected and retrieved.                           - External and internal hemorrhoids.                           - The examination was otherwise normal on direct                            and retroflexion views. Recommendation:           - Await pathology results.                           - EGD now.                           - Will try linzess. Milus Banister, MD 05/24/2020 10:12:41 AM This  report has been signed electronically.

## 2020-05-24 NOTE — Op Note (Signed)
[place] Patient Name: Karina Small Procedure Date: 05/24/2020 9:53 AM MRN: [id number] Endoscopist: Milus Banister , MD Age: 70 Referring MD:  Date of Birth: 1950-12-04 Gender: Female Account #: [id number] Procedure:                Upper GI endoscopy Indications:              Dysphagia, weight loss Medicines:                Monitored Anesthesia Care Procedure:                Pre-Anesthesia Assessment:                           - Prior to the procedure, a History and Physical                            was performed, and patient medications and                            allergies were reviewed. The patient's tolerance of                            previous anesthesia was also reviewed. The risks                            and benefits of the procedure and the sedation                            options and risks were discussed with the patient.                            All questions were answered, and informed consent                            was obtained. Prior Anticoagulants: The patient has                            taken no previous anticoagulant or antiplatelet                            agents. ASA Grade Assessment: II - A patient with                            mild systemic disease. After reviewing the risks                            and benefits, the patient was deemed in                            satisfactory condition to undergo the procedure.                           After obtaining informed consent, the endoscope was  passed under direct vision. Throughout the                            procedure, the patient's blood pressure, pulse, and                            oxygen saturations were monitored continuously. The                            Endoscope was introduced through the mouth, and                            advanced to the second part of duodenum. The upper                            GI endoscopy was accomplished  without difficulty.                            The patient tolerated the procedure well. Scope In: Scope Out: Findings:                 Mild inflammation characterized by erythema,                            friability and granularity was found in the                            stomach. Biopsies were taken with a cold forceps                            for histology.                           Normal appearing esophagus was biospsied, distally                            and proximally.                           The exam was otherwise without abnormality. Complications:            No immediate complications. Estimated blood loss:                            None. Estimated Blood Loss:     Estimated blood loss: none. Impression:               - Mild, non-specific gastritis. Biopsied to check                            for H. pylori.                           - Normal esophagus. Biopsied to check for EoE                           - The examination was  otherwise normal. Recommendation:           - Patient has a contact number available for                            emergencies. The signs and symptoms of potential                            delayed complications were discussed with the                            patient. Return to normal activities tomorrow.                            Written discharge instructions were provided to the                            patient.                           - Resume previous diet.                           - Continue present medications. New start                            omeprazole 40mg  pills, one pill 20-30 min before BF                            every day, disp 30 with 5 refills. Also new start                            linzess 59mcg pills, one pill daily, disp 30 with 5                            refills. Please continue to take your miralax every                            day for now as well.                           - Await pathology  results. Milus Banister, MD 05/24/2020 10:23:31 AM This report has been signed electronically.

## 2020-05-24 NOTE — Progress Notes (Signed)
Called to room to assist during endoscopic procedure.  Patient ID and intended procedure confirmed with present staff. Received instructions for my participation in the procedure from the performing physician.  

## 2020-05-24 NOTE — Patient Instructions (Signed)
Handout on polyps given.  Continue to take Miralax every day.  New medications called into pharmacy.    YOU HAD AN ENDOSCOPIC PROCEDURE TODAY AT [facility]:   Refer to the procedure report that was given to you for any specific questions about what was found during the examination.  If the procedure report does not answer your questions, please call your gastroenterologist to clarify.  If you requested that your care partner not be given the details of your procedure findings, then the procedure report has been included in a sealed envelope for you to review at your convenience later.  YOU SHOULD EXPECT: Some feelings of bloating in the abdomen. Passage of more gas than usual.  Walking can help get rid of the air that was put into your GI tract during the procedure and reduce the bloating. If you had a lower endoscopy (such as a colonoscopy or flexible sigmoidoscopy) you may notice spotting of blood in your stool or on the toilet paper. If you underwent a bowel prep for your procedure, you may not have a normal bowel movement for a few days.  Please Note:  You might notice some irritation and congestion in your nose or some drainage.  This is from the oxygen used during your procedure.  There is no need for concern and it should clear up in a day or so.  SYMPTOMS TO REPORT IMMEDIATELY:   Following lower endoscopy (colonoscopy or flexible sigmoidoscopy):  Excessive amounts of blood in the stool  Significant tenderness or worsening of abdominal pains  Swelling of the abdomen that is new, acute  Fever of 100F or higher   Following upper endoscopy (EGD)  Vomiting of blood or coffee ground material  New chest pain or pain under the shoulder blades  Painful or persistently difficult swallowing  New shortness of breath  Fever of 100F or higher  Black, tarry-looking stools  For urgent or emergent issues, a gastroenterologist can be reached at any hour by calling [phone]  [phone]. Do not use MyChart messaging for urgent concerns.    DIET:  We do recommend a small meal at first, but then you may proceed to your regular diet.  Drink plenty of fluids but you should avoid alcoholic beverages for 24 hours.  ACTIVITY:  You should plan to take it easy for the rest of today and you should NOT DRIVE or use heavy machinery until tomorrow (because of the sedation medicines used during the test).    FOLLOW UP: Our staff will call the number listed on your records 48-72 hours following your procedure to check on you and address any questions or concerns that you may have regarding the information given to you following your procedure. If we do not reach you, we will leave a message.  We will attempt to reach you two times.  During this call, we will ask if you have developed any symptoms of COVID 19. If you develop any symptoms (ie: fever, flu-like symptoms, shortness of breath, cough etc.) before then, please call [phone].  If you test positive for Covid 19 in the 2 weeks post procedure, please call and report this information to [place].    If any biopsies were taken you will be contacted by phone or by letter within the next 1-3 weeks.  Please call us at [phone] if you have not heard about the biopsies in 3 weeks.    SIGNATURES/CONFIDENTIALITY: You and/or your care partner have signed paperwork which will  be entered into your electronic medical record.  These signatures attest to the fact that that the information above on your After Visit Summary has been reviewed and is understood.  Full responsibility of the confidentiality of this discharge information lies with you and/or your care-partner. 

## 2020-05-29 ENCOUNTER — Telehealth: Payer: Self-pay | Admitting: *Deleted

## 2020-05-29 NOTE — Telephone Encounter (Signed)
  Follow up Call-  Call back number 05/24/2020  Post procedure Call Back phone  # [phone]  Permission to leave phone message Yes  Some recent data might be hidden    LMOM to call back with any questions or concerns.  Also, call back if patient has developed fever, respiratory issues or been dx with COVID or had any family members or close contacts diagnosed since her procedure.

## 2020-05-29 NOTE — Telephone Encounter (Signed)
Attempted f/u phone call. No answer. Left message. °

## 2020-06-02 ENCOUNTER — Encounter: Payer: Self-pay | Admitting: Gastroenterology

## 2020-06-13 ENCOUNTER — Ambulatory Visit: Payer: Medicare Other | Admitting: Medical

## 2020-09-22 ENCOUNTER — Encounter: Payer: Self-pay | Admitting: Internal Medicine

## 2020-10-09 ENCOUNTER — Other Ambulatory Visit: Payer: Self-pay

## 2020-10-09 ENCOUNTER — Encounter: Payer: Self-pay | Admitting: Medical

## 2020-10-09 ENCOUNTER — Ambulatory Visit (INDEPENDENT_AMBULATORY_CARE_PROVIDER_SITE_OTHER): Payer: Medicare Other | Admitting: Medical

## 2020-10-09 VITALS — BP 120/68 | HR 89 | Ht 61.0 in | Wt 114.8 lb

## 2020-10-09 DIAGNOSIS — K219 Gastro-esophageal reflux disease without esophagitis: Secondary | ICD-10-CM

## 2020-10-09 DIAGNOSIS — H9190 Unspecified hearing loss, unspecified ear: Secondary | ICD-10-CM

## 2020-10-09 DIAGNOSIS — M25511 Pain in right shoulder: Secondary | ICD-10-CM

## 2020-10-09 DIAGNOSIS — Z23 Encounter for immunization: Secondary | ICD-10-CM

## 2020-10-09 DIAGNOSIS — Z13220 Encounter for screening for lipoid disorders: Secondary | ICD-10-CM | POA: Diagnosis not present

## 2020-10-09 DIAGNOSIS — E569 Vitamin deficiency, unspecified: Secondary | ICD-10-CM

## 2020-10-09 DIAGNOSIS — Z0000 Encounter for general adult medical examination without abnormal findings: Secondary | ICD-10-CM | POA: Diagnosis not present

## 2020-10-09 DIAGNOSIS — J4520 Mild intermittent asthma, uncomplicated: Secondary | ICD-10-CM | POA: Insufficient documentation

## 2020-10-09 DIAGNOSIS — G8929 Other chronic pain: Secondary | ICD-10-CM

## 2020-10-09 DIAGNOSIS — M8580 Other specified disorders of bone density and structure, unspecified site: Secondary | ICD-10-CM

## 2020-10-09 DIAGNOSIS — M7712 Lateral epicondylitis, left elbow: Secondary | ICD-10-CM

## 2020-10-09 DIAGNOSIS — M25611 Stiffness of right shoulder, not elsewhere classified: Secondary | ICD-10-CM

## 2020-10-09 DIAGNOSIS — Z7185 Encounter for immunization safety counseling: Secondary | ICD-10-CM

## 2020-10-09 MED ORDER — ALBUTEROL SULFATE HFA 108 (90 BASE) MCG/ACT IN AERS: 2.0000 | INHALATION_SPRAY | Freq: Four times a day (QID) | RESPIRATORY_TRACT | 1 refills | Status: DC | PRN

## 2020-10-09 NOTE — Progress Notes (Signed)
Subjective:    Karina Small is a 70 y.o. female who presents for Preventative Services visit and chronic medical problems/med check visit.    Primary Care Provider Tysinger, Camelia Eng, PA-C here for primary care  Current Health Care Team: Dentist, Dr. Sherwood Gambler Eye doctor, Dr. Not sure of name Dr. Raphael Gibney, Dr. Leo Grosser gynecology Dr. Harold Hedge, allergist Dr. Owens Loffler, [place] you may have received from other than [facility] providers in the past year (date may be approximate) Dr. Doneta Public- Allergy Dr. Marisue Ivan- [facility] [place]  Exercise Current exercise habits:  every other day    Nutrition/Diet Current diet: in general, a "healthy" diet  , well balanced  Depression Screen Depression screen [facility] 2/9 10/09/2020  Decreased Interest 0  Down, Depressed, Hopeless 0  PHQ - 2 Score 0    Activities of Daily Living Screen/Functional Status Survey Is the patient deaf or have difficulty hearing?: Yes (been going on a while) Does the patient have difficulty seeing, even when wearing glasses/contacts?: No Does the patient have difficulty concentrating, remembering, or making decisions?: No Does the patient have difficulty walking or climbing stairs?: No Does the patient have difficulty dressing or bathing?: No Does the patient have difficulty doing errands alone such as visiting a doctor's office or shopping?: No  Can patient draw a clock face showing 3:15 oclock, yes  Fall Risk Screen Fall Risk  10/09/2020 03/24/2020  Falls in the past year? 0 0  Number falls in past yr: 0 -  Injury with Fall? 0 -  Risk for fall due to : No Fall Risks No Fall Risks  Follow up Falls evaluation completed Falls evaluation completed    Gait Assessment: Normal gait observed yes  Advanced directives Does patient have a [place]? Not sure. She thinks so Does patient have a Living Will? She thinks so but not sure  Past Medical History:   Diagnosis Date   Allergy    Asthma    Environmental allergies    GERD (gastroesophageal reflux disease)    Osteoporosis    PONV (postoperative nausea and vomiting)     Past Surgical History:  Procedure Laterality Date   ROOT CANAL      Social History   Socioeconomic History   Marital status: Married    Spouse name: Not on file   Number of children: Not on file   Years of education: Not on file   Highest education level: Not on file  Occupational History   Not on file  Tobacco Use   Smoking status: Never   Smokeless tobacco: Never  Vaping Use   Vaping Use: Never used  Substance and Sexual Activity   Alcohol use: No   Drug use: No   Sexual activity: Yes    Birth control/protection: None  Other Topics Concern   Not on file  Social History Narrative   Not on file   Social Determinants of Health   Financial Resource Strain: Not on file  Food Insecurity: Not on file  Transportation Needs: Not on file  Physical Activity: Not on file  Stress: Not on file  Social Connections: Not on file  Intimate Partner Violence: Not on file    Family History  Problem Relation Age of Onset   Heart attack Father    Breast cancer Mother    Osteoporosis Mother    Colon cancer Neg Hx    Stomach cancer Neg Hx    Esophageal cancer Neg  Hx    Pancreatic cancer Neg Hx    Liver disease Neg Hx      Current Outpatient Medications:    albuterol (VENTOLIN HFA) 108 (90 Base) MCG/ACT inhaler, Inhale 2 puffs into the lungs every 6 (six) hours as needed for wheezing or shortness of breath., Disp: 8 g, Rfl: 1   cetirizine (ZYRTEC) 10 MG tablet, 1 tablet as needed, Disp: , Rfl:    linaclotide (LINZESS) 72 MCG capsule, Take 1 capsule (72 mcg total) by mouth daily before breakfast., Disp: 30 capsule, Rfl: 5   omeprazole (PRILOSEC) 40 MG capsule, Take 1 capsule (40 mg total) by mouth daily. One pill 20-30 minutes before breakfast every day., Disp: 30 capsule, Rfl: 5   hydrocortisone 2.5 %  ointment, Apply topically 2 (two) times daily. (Patient not taking: Reported on 10/09/2020), Disp: 30 g, Rfl: 0  Allergies  Allergen Reactions   Prednisone     REACTION: nausea   Risedronate Sodium     REACTION: nausea, heartburn, chest pain    History reviewed: allergies, current medications, past family history, past medical history, past social history, past surgical history and problem list  Chronic issues discussed: Chronic constipation - uses Linzess with good relief  Allergies - uses zyrtec  Asthma -doing ok, occasional use of inhaler.   Needs refill.    GERD - taking Prilosec/omeprazole  Mild hearing loss-she has spoken to audiology in the past and had a evaluation.  Not currently using hearing aids.  Acute issues discussed: Having ongoing left eblow and right shoulder pain.She denies specific injury.  Her right shoulder has been hurting for months.  No fall no injury.  She helps watch her her nieces kids regularly including several babies.  She holds them daily.  No other repetitive motion.   Objective:     BP 120/68   Pulse 89   Ht '5\' 1"'$  (1.549 m)   Wt 114 lb 12.8 oz (52.1 kg)   LMP 04/04/1982   SpO2 97%   BMI 21.69 kg/m  Wt Readings from Last 3 Encounters:  10/09/20 114 lb 12.8 oz (52.1 kg)  05/24/20 117 lb (53.1 kg)  04/26/20 117 lb 9.6 oz (53.3 kg)     Biometrics BP 120/68   Pulse 89   Ht '5\' 1"'$  (1.549 m)   Wt 114 lb 12.8 oz (52.1 kg)   LMP 04/04/1982   SpO2 97%   BMI 21.69 kg/m   Alert and oriented x3, normal appearance, recall normal, can perform simple calculations, displays appropriate judgment.  Well-developed well-nourished no acute distress, African-American female There seems to be some muscle atrophy in general no specific asymmetrical atrophy noted.  No edema, no decreased functional status  HEENT: normocephalic, sclerae anicteric, TMs pearly, nares patent, no discharge or erythema, pharynx normal Oral cavity: MMM, no lesions Neck:  supple, no lymphadenopathy, no thyromegaly, no masses Heart: RRR, normal S1, S2, no murmurs Lungs: CTA bilaterally, no wheezes, rhonchi, or rales Abdomen: +bs, soft, non tender, non distended, no masses, no hepatomegaly, no splenomegaly Musculoskeletal: Tender over left lateral epicondyle of elbow,  otherwise nontender normal range of motion of elbow, right shoulder decreased internal and external range of motion but nontender to palpation, no swelling, no deformity.  Nontender, no swelling, no obvious deformity Extremities: no edema, no cyanosis, no clubbing Pulses: 2+ symmetric, upper and lower extremities, normal cap refill Neurological: alert, oriented x 3, CN2-12 intact, strength normal upper extremities and lower extremities, sensation normal throughout, DTRs 2+ throughout, no cerebellar  signs, gait normal Psychiatric: normal affect, behavior normal, pleasant  Declines resting on exam today, sees gynecology   Assessment:   Encounter Diagnoses  Name Primary?   Encounter for health maintenance examination in adult Yes   Need for Tdap vaccination    Need for influenza vaccination    Vitamin deficiency    Screening for lipid disorders    Left lateral epicondylitis    Chronic right shoulder pain    Decreased range of motion of right shoulder    Low bone mass    Vaccine counseling    Mild intermittent asthma, unspecified whether complicated    Hearing loss, unspecified hearing loss type, unspecified laterality    Gastroesophageal reflux disease, unspecified whether esophagitis present      Plan:   A preventative services visit was completed today.  During the course of the visit today, we discussed and counseled about appropriate screening and preventive services.  A health risk assessment was established today that included a review of current medications, allergies, social history, family history, medical and preventative health history, biometrics, and preventative screenings to  identify potential safety concerns or impairments.   This visit was a preventative care visit, also known as wellness visit or routine physical.   Topics typically include healthy lifestyle, diet, exercise, preventative care, vaccinations, sick and well care, proper use of emergency dept and after hours care, as well as other concerns.     Recommendations: Continue to return yearly for your annual wellness and preventative care visits.  This gives [place] a chance to discuss healthy lifestyle, exercise, vaccinations, review your chart record, and perform screenings where appropriate.  I recommend you see your eye doctor yearly for routine vision care.  I recommend you see your dentist yearly for routine dental care including hygiene visits twice yearly.   Vaccination recommendations were reviewed Immunization History  Administered Date(s) Administered   Fluad Quad(high Dose 65+) 03/24/2020, 10/09/2020   Influenza, High Dose Seasonal PF 01/18/2019   PFIZER(Purple Top)SARS-COV-2 Vaccination 03/27/2019, 04/21/2019, 08/09/2019, 01/03/2020   Pneumococcal Polysaccharide-23 01/18/2019, 03/24/2020, 08/21/2020   Tdap 10/09/2020   Zoster Recombinat (Shingrix) 09/16/2018, 12/06/2018   Counseled on the influenza virus vaccine.  Vaccine information sheet given.   High dose Influenza vaccine given after consent obtained.  Counseled on the Tdap (tetanus, diptheria, and acellular pertussis) vaccine.  Vaccine information sheet given. Tdap vaccine given after consent obtained.    Screening for cancer: Colon cancer screening: I reviewed your colonoscopy on file that is up to date from 2022  Breast cancer screening: You should perform a self breast exam monthly.   We reviewed recommendations for regular mammograms and breast cancer screening. Follow-up with gynecology as planned  Cervical cancer screening: We reviewed recommendations for pap smear screening.  She notes the last few Pap smears have  been normal.  She sees gynecology.   Skin cancer screening: Check your skin regularly for new changes, growing lesions, or other lesions of concern Come in for evaluation if you have skin lesions of concern.  Lung cancer screening: If you have a greater than 20 pack year history of tobacco use, then you may qualify for lung cancer screening with a chest CT scan.   Please call your insurance company to inquire about coverage for this test.  We currently don't have screenings for other cancers besides breast, cervical, colon, and lung cancers.  If you have a strong family history of cancer or have other cancer screening concerns, please let me know.  Bone health: Get at least 150 minutes of aerobic exercise weekly Get weight bearing exercise at least once weekly Bone density test:  A bone density test is an imaging test that uses a type of X-ray to measure the amount of calcium and other minerals in your bones. The test may be used to diagnose or screen you for a condition that causes weak or thin bones (osteoporosis), predict your risk for a broken bone (fracture), or determine how well your osteoporosis treatment is working. The bone density test is recommended for females 61 and older, or females or males [id number] if certain risk factors such as thyroid disease, long term use of steroids such as for asthma or rheumatological issues, vitamin D deficiency, estrogen deficiency, family history of osteoporosis, self or family history of fragility fracture in first degree relative.  I recommend updated bone density test.  She will check with gynecology although she had her last one there  Heart health: Get at least 150 minutes of aerobic exercise weekly Limit alcohol It is important to maintain a healthy blood pressure and healthy cholesterol numbers  Heart disease screening: Screening for heart disease includes screening for blood pressure, fasting lipids, glucose/diabetes screening, BMI height  to weight ratio, reviewed of smoking status, physical activity, and diet.    Goals include blood pressure 120/80 or less, maintaining a healthy lipid/cholesterol profile, preventing diabetes or keeping diabetes numbers under good control, not smoking or using tobacco products, exercising most days per week or at least 150 minutes per week of exercise, and eating healthy variety of fruits and vegetables, healthy oils, and avoiding unhealthy food choices like fried food, fast food, high sugar and high cholesterol foods.    Other tests may possibly include EKG test, CT coronary calcium score, echocardiogram, exercise treadmill stress test.     Medical care options: I recommend you continue to seek care here first for routine care.  We try really hard to have available appointments Monday through Friday daytime hours for sick visits, acute visits, and physicals.  Urgent care should be used for after hours and weekends for significant issues that cannot wait till the next day.  The emergency department should be used for significant potentially life-threatening emergencies.  The emergency department is expensive, can often have long wait times for less significant concerns, so try to utilize primary care, urgent care, or telemedicine when possible to avoid unnecessary trips to the emergency department.  Virtual visits and telemedicine have been introduced since the pandemic started in 2020, and can be convenient ways to receive medical care.  We offer virtual appointments as well to assist you in a variety of options to seek medical care.   Advanced Directives: I recommend you consider completing a [place] and Living Will.   These documents respect your wishes and help alleviate burdens on your loved ones if you were to become terminally ill or be in a position to need those documents enforced.    You can complete Advanced Directives yourself, have them notarized, then have copies  made for our office, for you and for anybody you feel should have them in safe keeping.  Or, you can have an attorney prepare these documents.   If you haven't updated your Last Will and Testament in a while, it may be worthwhile having an attorney prepare these documents together and save on some costs.        Specific Concerns:  Left lateral epicondylitis-advise rest, ice therapy 20  minutes on 20 minutes off, and get a boppy pillow to help hold the baby's when you are cuddleing and holding them since she is helping take care of her grandbabies.  I believe holding babies for prolonged periods without any support in your arm has led to some of this inflammation.      Right shoulder pain, decreased range of motion,-go for x-ray  Low bone mass, possible history of osteoporosis-advise updated bone density testing with last one was around 2-3 years ago.  I do not have a copy of that result.  Asthma-mild intermittent, refilled inhaler for as needed  Chronic constipation-doing okay on current medication   Maesa was seen today for fasting cpe.  Diagnoses and all orders for this visit:  Encounter for health maintenance examination in adult -     Lipid panel -     VITAMIN D 25 Hydroxy (Vit-D Deficiency, Fractures) -     DG Shoulder Right; Future  Need for Tdap vaccination -     Tdap vaccine greater than or equal to 7yo IM  Need for influenza vaccination -     Flu Vaccine QUAD High Dose(Fluad)  Vitamin deficiency -     VITAMIN D 25 Hydroxy (Vit-D Deficiency, Fractures)  Screening for lipid disorders -     Lipid panel  Left lateral epicondylitis  Chronic right shoulder pain  Decreased range of motion of right shoulder  Low bone mass  Vaccine counseling  Mild intermittent asthma, unspecified whether complicated  Hearing loss, unspecified hearing loss type, unspecified laterality  Gastroesophageal reflux disease, unspecified whether esophagitis present  Other orders -      albuterol (VENTOLIN HFA) 108 (90 Base) MCG/ACT inhaler; Inhale 2 puffs into the lungs every 6 (six) hours as needed for wheezing or shortness of breath.    Attestation A preventative services visit was completed today.  During the course of the visit the patient was educated and counseled about appropriate screening and preventive services.  A health risk assessment was established with the patient that included a review of current medications, allergies, social history, family history, medical and preventative health history, biometrics, and preventative screenings to identify potential safety concerns or impairments.  A personalized plan was printed today for the patient's records and use.   Personalized health advice and education was given today to reduce health risks and promote self management and wellness.  Information regarding end of life planning was discussed today.  Dorothea Ogle, PA-C   10/09/2020

## 2020-10-10 ENCOUNTER — Other Ambulatory Visit: Payer: Self-pay | Admitting: Medical

## 2020-10-10 DIAGNOSIS — K5909 Other constipation: Secondary | ICD-10-CM

## 2020-10-10 LAB — LIPID PANEL
Chol/HDL Ratio: 3 ratio (ref 0.0–4.4)
Cholesterol, Total: 196 mg/dL (ref 100–199)
HDL: 65 mg/dL (ref >39)
LDL Chol Calc (NIH): 120 mg/dL — ABNORMAL HIGH (ref 0–99)
Triglycerides: 57 mg/dL (ref 0–149)
VLDL Cholesterol Cal: 11 mg/dL (ref 5–40)

## 2020-10-10 LAB — VITAMIN D 25 HYDROXY (VIT D DEFICIENCY, FRACTURES): Vit D, 25-Hydroxy: 22.6 ng/mL — ABNORMAL LOW (ref 30.0–100.0)

## 2020-10-10 MED ORDER — VITAMIN D (ERGOCALCIFEROL) 1.25 MG (50000 UNIT) PO CAPS: 50000.0000 [IU] | ORAL_CAPSULE | ORAL | 1 refills | Status: DC

## 2020-10-10 NOTE — Progress Notes (Signed)
Vit d 

## 2020-10-11 ENCOUNTER — Encounter: Payer: Self-pay | Admitting: Internal Medicine

## 2020-12-02 ENCOUNTER — Other Ambulatory Visit: Payer: Self-pay | Admitting: Gastroenterology

## 2020-12-02 DIAGNOSIS — K2970 Gastritis, unspecified, without bleeding: Secondary | ICD-10-CM

## 2021-10-12 ENCOUNTER — Telehealth: Payer: Self-pay | Admitting: Medical

## 2021-10-12 ENCOUNTER — Ambulatory Visit: Payer: BC Managed Care – PPO | Admitting: Medical

## 2021-10-12 NOTE — Progress Notes (Deleted)
Karina Small is a 71 y.o. female who presents for annual wellness visit and follow-up on chronic medical conditions.  She has the following concerns:  Immunizations and Health Maintenance Immunization History  Administered Date(s) Administered   Fluad Quad(high Dose 65+) 03/24/2020, 10/09/2020   Influenza, High Dose Seasonal PF 01/18/2019   PFIZER(Purple Top)SARS-COV-2 Vaccination 03/27/2019, 04/21/2019, 08/09/2019, 01/03/2020   Pneumococcal Polysaccharide-23 01/18/2019, 03/24/2020, 08/21/2020   Tdap 10/09/2020   Zoster Recombinat (Shingrix) 09/16/2018, 12/06/2018   Health Maintenance Due  Topic Date Due   Hepatitis C Screening  Never done   DEXA SCAN  Never done   COVID-19 Vaccine (5 - Pfizer series) 02/28/2020   MAMMOGRAM  01/25/2021   Pneumonia Vaccine 27+ Years old (2 - PCV) 08/21/2021   INFLUENZA VACCINE  09/11/2021    Last Pap smear: Last mammogram: Last colonoscopy: Last DEXA: Dentist: Ophtho: Exercise:  Other doctors caring for patient include:  Advanced directives:    Depression screen:  See questionnaire below.     10/09/2020   10:50 AM 03/24/2020   11:19 AM  Depression screen PHQ 2/9  Decreased Interest 0 0  Down, Depressed, Hopeless 0 0  PHQ - 2 Score 0 0    Fall Risk Screen: see questionnaire below.    10/09/2020   10:50 AM 03/24/2020   11:19 AM  Fall Risk   Falls in the past year? 0 0  Number falls in past yr: 0   Injury with Fall? 0   Risk for fall due to : No Fall Risks No Fall Risks  Follow up Falls evaluation completed Falls evaluation completed    ADL screen:  See questionnaire below Functional Status Survey:     Review of Systems Constitutional: -, -unexpected weight change, -anorexia, -fatigue Allergy: -sneezing, -itching, -congestion Dermatology: denies changing moles, rash, lumps ENT: -runny nose, -ear pain, -sore throat,  Cardiology:  -chest pain, -palpitations, -orthopnea, Respiratory: -cough, -shortness of breath,  -dyspnea on exertion, -wheezing,  Gastroenterology: -abdominal pain, -nausea, -vomiting, -diarrhea, -constipation, -dysphagia Hematology: -bleeding or bruising problems Musculoskeletal: -arthralgias, -myalgias, -joint swelling, -back pain, - Ophthalmology: -vision changes,  Urology: -dysuria, -difficulty urinating,  -urinary frequency, -urgency, incontinence Neurology: -, -numbness, , -memory loss, -falls, -dizziness    PHYSICAL EXAM:  LMP 04/04/1982   General Appearance: Alert, cooperative, no distress, appears stated age Head: Normocephalic, without obvious abnormality, atraumatic Eyes: PERRL, conjunctiva/corneas clear, EOM's intact, fundi benign Ears: Normal TM's and external ear canals Nose: Nares normal, mucosa normal, no drainage or sinus tenderness Throat: Lips, mucosa, and tongue normal; teeth and gums normal Neck: Supple, no lymphadenopathy;  thyroid:  no enlargement/tenderness/nodules; no carotid bruit or JVD Lungs: Clear to auscultation bilaterally without wheezes, rales or ronchi; respirations unlabored Heart: Regular rate and rhythm, S1 and S2 normal, no murmur, rubor gallop Abdomen: Soft, non-tender, nondistended, normoactive bowel sounds,  no masses, no hepatosplenomegaly Extremities: No clubbing, cyanosis or edema Pulses: 2+ and symmetric all extremities Skin:  Skin color, texture, turgor normal, no rashes or lesions Lymph nodes: Cervical, supraclavicular, and axillary nodes normal Neurologic:  CNII-XII intact, normal strength, sensation and gait; reflexes 2+ and symmetric throughout Psych: Normal mood, affect, hygiene and grooming.  ASSESSMENT/PLAN:    Discussed monthly self breast exams and yearly mammograms; at least 30 minutes of aerobic activity at least 5 days/week and weight-bearing exercise 2x/week; proper sunscreen use reviewed; healthy diet, including goals of calcium and vitamin D intake and alcohol recommendations (less than or equal to 1 drink/day)  reviewed; regular seatbelt use; changing  batteries in smoke detectors.  Immunization recommendations discussed.  Colonoscopy recommendations reviewed   Medicare Attestation I have personally reviewed: The patient's medical and social history Their use of alcohol, tobacco or illicit drugs Their current medications and supplements The patient's functional ability including ADLs,fall risks, home safety risks, cognitive, and hearing and visual impairment Diet and physical activities Evidence for depression or mood disorders  The patient's weight, height, and BMI have been recorded in the chart.  I have made referrals, counseling, and provided education to the patient based on review of the above and I have provided the patient with a written personalized care plan for preventive services.     Dorothea Ogle, PA-C   10/12/2021

## 2021-10-12 NOTE — Telephone Encounter (Signed)

## 2021-10-16 ENCOUNTER — Encounter: Payer: Self-pay | Admitting: Medical

## 2021-10-19 ENCOUNTER — Telehealth: Payer: Self-pay | Admitting: Medical

## 2021-10-19 NOTE — Telephone Encounter (Signed)
Tried calling patient to schedule AWVs No answer

## 2021-10-31 ENCOUNTER — Telehealth: Payer: Self-pay | Admitting: Medical

## 2021-10-31 NOTE — Telephone Encounter (Signed)
Tried calling patient to schedule AWV No answer

## 2021-12-31 ENCOUNTER — Ambulatory Visit (INDEPENDENT_AMBULATORY_CARE_PROVIDER_SITE_OTHER): Payer: Medicare Other | Admitting: Nurse Practitioner

## 2021-12-31 VITALS — BP 120/64 | HR 74 | Wt 117.0 lb

## 2021-12-31 DIAGNOSIS — M62838 Other muscle spasm: Secondary | ICD-10-CM

## 2021-12-31 DIAGNOSIS — F5104 Psychophysiologic insomnia: Secondary | ICD-10-CM

## 2021-12-31 MED ORDER — HYDROXYZINE HCL 50 MG PO TABS: 50.0000 mg | ORAL_TABLET | Freq: Every evening | ORAL | 3 refills | Status: DC | PRN

## 2021-12-31 NOTE — Patient Instructions (Addendum)
I want to check some labs to see if there could be something with your vitamin D or parathyroid that could be causing the spasms. I have sent a referral to the neurologist to see if we can get any further answers from them.   I want you to rest as much as possible and make sure you are drinking plenty of water.   I have sent in the hydroxyzine to see if this helps you with sleep.

## 2021-12-31 NOTE — Progress Notes (Signed)
Orma Render, DNP, AGNP-c [facility] [address]  [place], [place] [address] [phone]  Subjective:   Karina Small is a 71 y.o. female presents to day for evaluation of: ongoing cough (Ongoing cough. Seen [facility] in [place] 3 times and no better. Told she had acute bronchlitis, went to to ED yesterday - after coughing- would have movement spasms lasting 3 hours- was in hands, neck and legs)   She started having leg cramps over the last two months and an involuntary twitch in her right thumb Following that she started having coughing spasms, bringing up mucous, she felt like she couldn't breath because of increased mucous, and then she started twitching. She tells me she has not had this happen in 10-15 years. She tells me at the time she saw Dr. Erling Cruz with Neurology and they did a battery of tests but were unable to find why she was twitching.  She tells me she has had this happen a couple of times this year but it was just very "light" and only for a second.  During the spasms she tells me her head is "thrown back" and her right arm "flies up" without control. She also tells me that her legs will move independently like she is kicking without her moving it. She was seen in the ED for this and a CT was completed, but they were unable to determine a cause. In the past this has been present when she was "overly tired" and resolved spontaneously.   PMH, Medications, and Allergies reviewed and updated in chart as appropriate.   ROS negative except for what is listed in HPI. Objective:  BP 120/64   Pulse 74   Wt 117 lb (53.1 kg)   LMP 04/04/1982   SpO2 98%   BMI 22.11 kg/m  Physical Exam Vitals and nursing note reviewed.  Constitutional:      General: She is not in acute distress.    Appearance: Normal appearance. She is not ill-appearing.  HENT:     Head: Normocephalic and atraumatic.     Right Ear: Tympanic membrane  normal.     Left Ear: Tympanic membrane normal.     Nose: Nose normal.     Mouth/Throat:     Mouth: Mucous membranes are moist.     Pharynx: Oropharynx is clear.  Eyes:     General:        Right eye: No discharge.        Left eye: No discharge.     Extraocular Movements: Extraocular movements intact.     Conjunctiva/sclera: Conjunctivae normal.     Pupils: Pupils are equal, round, and reactive to light.  Neck:     Vascular: No carotid bruit.  Cardiovascular:     Rate and Rhythm: Normal rate and regular rhythm.     Pulses: Normal pulses.     Heart sounds: Normal heart sounds.  Pulmonary:     Effort: Pulmonary effort is normal.     Breath sounds: Normal breath sounds.  Abdominal:     General: Bowel sounds are normal.     Palpations: Abdomen is soft.  Musculoskeletal:        General: Normal range of motion.     Cervical back: Normal range of motion and neck supple. No rigidity or tenderness.  Lymphadenopathy:     Cervical: No cervical adenopathy.  Skin:    General: Skin is warm and dry.  Capillary Refill: Capillary refill takes less than 2 seconds.  Neurological:     General: No focal deficit present.     Mental Status: She is alert and oriented to person, place, and time.     Cranial Nerves: No cranial nerve deficit.     Sensory: No sensory deficit.     Motor: No weakness.     Coordination: Coordination normal.     Gait: Gait normal.     Deep Tendon Reflexes: Reflexes normal.  Psychiatric:        Mood and Affect: Mood normal.        Behavior: Behavior normal.        Thought Content: Thought content normal.        Judgment: Judgment normal.           Assessment & Plan:   Problem List Items Addressed This Visit     Muscle spasticity - Primary    Involuntary muscle spasticity of the right arm and neck with no apparent cause. This is also present in the lower extremities, but to a lesser extent. Neuro examination today was benign. She shows no signs of weakness  or limitations. Spasticity was observed one time during the visit and appeared to be a quick backward motion of the head and elevation of the right arm above the head simultaneously. Will obtain labs for complete evaluation and send referral to neurology for further assessment and recommendations.       Relevant Orders   VITAMIN D 25 Hydroxy (Vit-D Deficiency, Fractures) (Completed)   Ambulatory referral to Neurology   B12 and Folate Panel (Completed)   Parathyroid hormone, intact (no Ca) (Completed)   Other Visit Diagnoses     Psychophysiological insomnia       Relevant Medications   hydrOXYzine (ATARAX) 50 MG tablet         Orma Render, DNP, AGNP-c 01/11/2022  8:28 AM    History, Medications, Surgery, SDOH, and Family History reviewed and updated as appropriate.

## 2022-01-01 LAB — B12 AND FOLATE PANEL
Folate: 17.7 ng/mL (ref >3.0)
Vitamin B-12: 376 pg/mL (ref 232–1245)

## 2022-01-01 LAB — PARATHYROID HORMONE, INTACT (NO CA): PTH: 22 pg/mL (ref 15–65)

## 2022-01-01 LAB — VITAMIN D 25 HYDROXY (VIT D DEFICIENCY, FRACTURES): Vit D, 25-Hydroxy: 30.1 ng/mL (ref 30.0–100.0)

## 2022-01-10 ENCOUNTER — Telehealth: Payer: Self-pay | Admitting: Medical

## 2022-01-10 NOTE — Telephone Encounter (Signed)
Pt hasn't heard back from referral, please call & let her know

## 2022-01-10 NOTE — Telephone Encounter (Signed)
Tried calling patient to schedule Medicare Annual Wellness Visit (AWV) either virtually or in office.   No answer   awvs please schedule with Nurse Health Adviser   45 min for awv-i and in office appointments 30 min for awv-s  phone/virtual appointments

## 2022-01-11 ENCOUNTER — Encounter: Payer: Self-pay | Admitting: Nurse Practitioner

## 2022-01-11 DIAGNOSIS — M62838 Other muscle spasm: Secondary | ICD-10-CM | POA: Insufficient documentation

## 2022-01-11 NOTE — Assessment & Plan Note (Signed)
Involuntary muscle spasticity of the right arm and neck with no apparent cause. This is also present in the lower extremities, but to a lesser extent. Neuro examination today was benign. She shows no signs of weakness or limitations. Spasticity was observed one time during the visit and appeared to be a quick backward motion of the head and elevation of the right arm above the head simultaneously. Will obtain labs for complete evaluation and send referral to neurology for further assessment and recommendations.

## 2022-01-15 ENCOUNTER — Ambulatory Visit (INDEPENDENT_AMBULATORY_CARE_PROVIDER_SITE_OTHER): Payer: Medicare Other | Admitting: Neurology

## 2022-01-15 ENCOUNTER — Encounter: Payer: Self-pay | Admitting: Nurse Practitioner

## 2022-01-15 ENCOUNTER — Ambulatory Visit (INDEPENDENT_AMBULATORY_CARE_PROVIDER_SITE_OTHER): Payer: Medicare Other | Admitting: Nurse Practitioner

## 2022-01-15 ENCOUNTER — Encounter: Payer: Self-pay | Admitting: Neurology

## 2022-01-15 VITALS — BP 112/70 | HR 78 | Temp 98.3°F | Wt 119.8 lb

## 2022-01-15 VITALS — BP 129/61 | HR 77 | Ht 61.0 in | Wt 120.0 lb

## 2022-01-15 DIAGNOSIS — M5481 Occipital neuralgia: Secondary | ICD-10-CM

## 2022-01-15 DIAGNOSIS — M542 Cervicalgia: Secondary | ICD-10-CM

## 2022-01-15 DIAGNOSIS — R251 Tremor, unspecified: Secondary | ICD-10-CM | POA: Diagnosis not present

## 2022-01-15 MED ORDER — DULOXETINE HCL 60 MG PO CPEP: 60.0000 mg | ORAL_CAPSULE | Freq: Every day | ORAL | 1 refills | Status: DC

## 2022-01-15 MED ORDER — RIZATRIPTAN BENZOATE 10 MG PO TABS: 10.0000 mg | ORAL_TABLET | ORAL | 3 refills | Status: AC | PRN

## 2022-01-15 MED ORDER — DULOXETINE HCL 30 MG PO CPEP: 30.0000 mg | ORAL_CAPSULE | Freq: Every day | ORAL | 0 refills | Status: DC

## 2022-01-15 MED ORDER — CYCLOBENZAPRINE HCL 5 MG PO TABS: 5.0000 mg | ORAL_TABLET | Freq: Every day | ORAL | 1 refills | Status: DC

## 2022-01-15 MED ORDER — AMBULATORY NON FORMULARY MEDICATION: 0 refills | Status: DC

## 2022-01-15 NOTE — Progress Notes (Signed)
Chief Complaint  Patient presents with   New Patient (Initial Visit)    Rm 15. Alone. NP internal referral for Involuntary muscle spasms of neck and right arm.      ASSESSMENT AND PLAN  Karina Small is a 71 y.o. female   Body and neck jerking movement, Neck pain, headache Anxiety  Normal CT head, essentially normal neurological examination now,  Her reported body jerking movement can be anxiety related, often triggered by cough,  Laboratory evaluation to rule out inflammatory process, including TSH  Cymbalta 30 mg titrating to 60 mg daily, Continue follow-up with primary care physician  DIAGNOSTIC DATA (LABS, IMAGING, TESTING) - I reviewed patient records, labs, notes, testing and imaging myself where available.   MEDICAL HISTORY:  Karina Small, is a 71 year old female, seen in request by   her primary care PA Tysinger, Camelia Eng for evaluation of neck pain, headache, episode of body jerking movement, initial evaluation January 15, 2022  I reviewed and summarized the referring note. PMHX. GERD  She is a retired Network engineer for [employer],  enjoying craft, drove to [place] 4 days in a week to take care of her niece 5 small children age from 24 to 30, denies gait abnormality, denies significant low back, neck pain, does have intermittent anxiety, her sister has suggested see personal consult, she thought it is her personal treat," and just that kind of person," she began to have chronic cough  Since September 2023, sometimes can evolve into a violent cough, on December 30, 2021, while attending church with her nieces, she began to cough, become so violent, she was taken to outside, complains of difficulty catching her breath, then she began to have whole body jerking movement, ambulance was called, she denied loss of consciousness, remembers the whole process, she showed me a video clip, she was able to carry on conversation, attached large amplitude neck  back-and-forth jerking movement, body and neck jerking last from 11:00 to 3 PM, eventually improved with Valium  CT head without contrast showed no significant abnormality Laboratory evaluation showed normal CMP creatinine 0.68, mild decrease hemoglobin of 12.2,  Her cough has improved, no longer has recurrent choking episode, but since that event, her neck has fling so much, she has constant upper neck, occipital area pain, denies visual loss, denies gait abnormality,   PHYSICAL EXAM:   Vitals:   01/15/22 0933  BP: 129/61  Pulse: 77  Weight: 120 lb (54.4 kg)  Height: '5\' 1"'$  (1.549 m)   Not recorded     Body mass index is 22.67 kg/m.  PHYSICAL EXAMNIATION:  Gen: NAD, conversant, well nourised, well groomed                     Cardiovascular: Regular rate rhythm, no peripheral edema, warm, nontender. Eyes: Conjunctivae clear without exudates or hemorrhage Neck: Supple, no carotid bruits.  Tenderness at bilateral nuchal line Pulmonary: Clear to auscultation bilaterally   NEUROLOGICAL EXAM:  MENTAL STATUS: Speech/cognition: Awake, alert, oriented to history taking and casual conversation CRANIAL NERVES: CN II: Visual fields are full to confrontation. Pupils are round equal and briskly reactive to light. CN III, IV, VI: extraocular movement are normal. No ptosis. CN V: Facial sensation is intact to light touch CN VII: Face is symmetric with normal eye closure  CN VIII: Hearing is normal to causal conversation. CN IX, X: Phonation is normal. CN XI: Head turning and shoulder shrug are intact  MOTOR: There is  no pronator drift of out-stretched arms. Muscle bulk and tone are normal. Muscle strength is normal.  REFLEXES: Reflexes are 1  and symmetric at the biceps, triceps, knees, and ankles. Plantar responses are flexor.  SENSORY: Intact to light touch,   COORDINATION: There is no trunk or limb dysmetria noted.  GAIT/STANCE: Posture is normal. Gait is steady with  normal steps, base, arm swing, and turning. Heel and toe walking are normal. Tandem gait is normal.  Romberg is absent.  REVIEW OF SYSTEMS:  Full 14 system review of systems performed and notable only for as above All other review of systems were negative.   ALLERGIES: Allergies  Allergen Reactions   Prednisone     REACTION: nausea   Risedronate Sodium     REACTION: nausea, heartburn, chest pain    HOME MEDICATIONS: Current Outpatient Medications  Medication Sig Dispense Refill   cetirizine (ZYRTEC) 10 MG tablet 1 tablet as needed     guaifenesin (ROBITUSSIN) 100 MG/5ML syrup Take 200 mg by mouth 3 (three) times daily as needed for cough.     ibuprofen (ADVIL) 200 MG tablet Take 200 mg by mouth every 6 (six) hours as needed.     linaclotide (LINZESS) 72 MCG capsule Take 1 capsule (72 mcg total) by mouth daily before breakfast. (Patient taking differently: Take 72 mcg by mouth as needed.) 30 capsule 5   omeprazole (PRILOSEC) 40 MG capsule TAKE 1 CAPSULE BY MOUTH EVERY DAY 20 TO 30 MINS BEFORE BREAKFAST 30 capsule 11   OVER THE COUNTER MEDICATION Take 1 tablet by mouth every other day. Clear Lax Gas Relief     OVER THE COUNTER MEDICATION Take 1 tablet by mouth as needed. Pain reliever 500 mg OTC     hydrOXYzine (ATARAX) 50 MG tablet Take 1 tablet (50 mg total) by mouth at bedtime as needed. For anxiety and sleep. (Patient not taking: Reported on 01/15/2022) 30 tablet 3   No current facility-administered medications for this visit.    PAST MEDICAL HISTORY: Past Medical History:  Diagnosis Date   Allergy    Asthma    Environmental allergies    GERD (gastroesophageal reflux disease)    Osteoporosis    PONV (postoperative nausea and vomiting)     PAST SURGICAL HISTORY: Past Surgical History:  Procedure Laterality Date   ROOT CANAL      FAMILY HISTORY: Family History  Problem Relation Age of Onset   Heart attack Father    Breast cancer Mother    Osteoporosis Mother     Colon cancer Neg Hx    Stomach cancer Neg Hx    Esophageal cancer Neg Hx    Pancreatic cancer Neg Hx    Liver disease Neg Hx     SOCIAL HISTORY: Social History   Socioeconomic History   Marital status: Married    Spouse name: Not on file   Number of children: Not on file   Years of education: Not on file   Highest education level: Not on file  Occupational History   Not on file  Tobacco Use   Smoking status: Never   Smokeless tobacco: Never  Vaping Use   Vaping Use: Never used  Substance and Sexual Activity   Alcohol use: No   Drug use: No   Sexual activity: Yes    Birth control/protection: None  Other Topics Concern   Not on file  Social History Narrative   Not on file   Social Determinants of Health   Financial  Resource Strain: Not on file  Food Insecurity: Not on file  Transportation Needs: Not on file  Physical Activity: Not on file  Stress: Not on file  Social Connections: Not on file  Intimate Partner Violence: Not on file      Marcial Pacas, M.D. Ph.D.  [facility] [address] [place], [place] [address] Ph: [phone] Fax: [phone]  CC:  Tysinger, Camelia Eng, PA-C [address] [place],  [place] [address]  Tysinger, Camelia Eng, PA-C

## 2022-01-15 NOTE — Progress Notes (Signed)
Orma Render, DNP, AGNP-c [place], [place] [address] [phone]  Subjective:   Karina Small is a 71 y.o. female presents to day for evaluation of: Headache: Patient complains of headache. She does have a headache at this time.  Description of Headaches: Location of pain: bilateral, occipital, frontal Radiation of pain?:bilateral Character of pain:aching, dull, and sharp only when moving the neck Severity of pain: 7 Accompanying symptoms: nausea, photophobia, vertigo, neck stiffness Prodromal sx?: none Rapidity of onset: gradual Typical duration of individual headache: several days Are most headaches similar in presentation? yes Typical precipitants: none  Current Use of Meds to Treat HA: Abortive meds? acetaminophen, NSAIDs (ibuprofen) Daily use? no Prophylactic meds? none  Additional Relevant History: History of head/neck trauma? no History of head/neck surgery? no Family h/o headache problems? no Use of meds that might worsen HAs? no Exposure to carbon monoxide? no Substance use: none  Neck Pain: Paitent complains of neck pain. Event that precipitate these symptoms: none known. Onset of symptoms 2 weeks ago, gradually worsening since that time. Current symptoms are  headaches . Patient denies  numbness, tingling, weakness . Patient has had  history of neck spasms .  Previous treatments include: medication: NSAID:   . She had PT about 10 years ago for her neck and back.   PMH, Medications, and Allergies reviewed and updated in chart as appropriate.   ROS negative except for what is listed in HPI. Objective:  BP 112/70   Pulse 78   Temp 98.3 F (36.8 C)   Wt 119 lb 12.8 oz (54.3 kg)   LMP 04/04/1982   BMI 22.64 kg/m  Physical Exam Vitals and nursing note reviewed.  Constitutional:      General: She is not in acute distress.    Appearance: Normal appearance.  HENT:     Head: Normocephalic.     Right Ear:  Tympanic membrane normal.     Left Ear: Tympanic membrane normal.     Mouth/Throat:     Mouth: Mucous membranes are moist.  Eyes:     Pupils: Pupils are equal, round, and reactive to light.  Neck:     Vascular: No carotid bruit.  Cardiovascular:     Rate and Rhythm: Normal rate and regular rhythm.     Pulses: Normal pulses.     Heart sounds: Normal heart sounds.  Pulmonary:     Effort: Pulmonary effort is normal.     Breath sounds: Normal breath sounds.  Musculoskeletal:     Cervical back: Tenderness present.  Lymphadenopathy:     Cervical: No cervical adenopathy.  Skin:    General: Skin is warm and dry.     Capillary Refill: Capillary refill takes less than 2 seconds.  Neurological:     General: No focal deficit present.     Mental Status: She is alert and oriented to person, place, and time.     Cranial Nerves: No cranial nerve deficit.     Sensory: No sensory deficit.     Motor: No weakness.     Gait: Gait normal.  Psychiatric:        Mood and Affect: Mood normal.           Assessment & Plan:   Problem List Items Addressed This Visit     Neck pain    Neck pain with headaches with no known etiology. She does have a history of spasms in the neck. Suspect occipital neuralgia or tension type  headache may be the cause. She is requesting a neck brace to help keep her neck in line while sleeping. Discussed that she needs to avoid wearing this regularly to prevent weakening of the muscles- she expressed understanding. Flexeril and maxalt sent for treatment as well as stretching exercises. Will monitor closely. No alarm sx present. If no response with at home treatment, recommend PT and imaging.       Relevant Medications   cyclobenzaprine (FLEXERIL) 5 MG tablet   AMBULATORY NON FORMULARY MEDICATION   Other Visit Diagnoses     Cervico-occipital neuralgia    -  Primary   Relevant Medications   cyclobenzaprine (FLEXERIL) 5 MG tablet   rizatriptan (MAXALT) 10 MG tablet    AMBULATORY NON FORMULARY MEDICATION         Orma Render, DNP, AGNP-c 01/21/2022  9:22 PM    History, Medications, Surgery, SDOH, and Family History reviewed and updated as appropriate.

## 2022-01-15 NOTE — Patient Instructions (Addendum)
I recommend taking a vitamin D3 supplement of 1000-2000iU daily.  I also recommend taking a B12 supplement of '500mg'$  daily.   I would like you to try the neck stretches at least twice a day. You can take the flexeril at bedtime to help with the spasms and tension in the neck. Take the rizatriptan at the first sign of a headache to help break this.   If you are still having symptoms after 2 weeks, please let me know and we will see about getting you in with formal physical therapy.

## 2022-01-16 LAB — CK: Total CK: 98 U/L (ref 32–182)

## 2022-01-16 LAB — TSH

## 2022-01-16 LAB — TSH RFX ON ABNORMAL TO FREE T4: TSH: 0.562 u[IU]/mL (ref 0.450–4.500)

## 2022-01-16 LAB — SEDIMENTATION RATE: Sed Rate: 2 mm/hr (ref 0–40)

## 2022-01-16 LAB — ANA W/REFLEX IF POSITIVE: Anti Nuclear Antibody (ANA): NEGATIVE

## 2022-01-17 ENCOUNTER — Telehealth: Payer: Self-pay

## 2022-01-17 NOTE — Telephone Encounter (Signed)
Pt verified by name and DOB, results given per provider, pt voiced understanding all question answered. 

## 2022-01-17 NOTE — Telephone Encounter (Signed)
-----   Message from Marcial Pacas, MD sent at 01/16/2022  4:02 PM EST ----- Please call patient, laboratory evaluation showed no significant abnormalities.

## 2022-01-21 NOTE — Assessment & Plan Note (Signed)
Neck pain with headaches with no known etiology. She does have a history of spasms in the neck. Suspect occipital neuralgia or tension type headache may be the cause. She is requesting a neck brace to help keep her neck in line while sleeping. Discussed that she needs to avoid wearing this regularly to prevent weakening of the muscles- she expressed understanding. Flexeril and maxalt sent for treatment as well as stretching exercises. Will monitor closely. No alarm sx present. If no response with at home treatment, recommend PT and imaging.

## 2022-02-01 ENCOUNTER — Ambulatory Visit (INDEPENDENT_AMBULATORY_CARE_PROVIDER_SITE_OTHER): Payer: Medicare Other | Admitting: Nurse Practitioner

## 2022-02-01 ENCOUNTER — Encounter: Payer: Self-pay | Admitting: Nurse Practitioner

## 2022-02-01 VITALS — BP 128/74 | HR 71 | Temp 98.1°F | Ht 61.5 in | Wt 119.2 lb

## 2022-02-01 DIAGNOSIS — Z0000 Encounter for general adult medical examination without abnormal findings: Secondary | ICD-10-CM

## 2022-02-01 DIAGNOSIS — Z23 Encounter for immunization: Secondary | ICD-10-CM | POA: Diagnosis not present

## 2022-02-01 DIAGNOSIS — Z1231 Encounter for screening mammogram for malignant neoplasm of breast: Secondary | ICD-10-CM

## 2022-02-01 DIAGNOSIS — Z13820 Encounter for screening for osteoporosis: Secondary | ICD-10-CM

## 2022-02-01 NOTE — Progress Notes (Unsigned)
[facility] [place] at [facility] [address]  [place],   [address] [place] VISIT  02/01/2022  Subjective:  Karina Small is a 71 y.o. female patient of Anayansi Rundquist, Coralee Pesa, NP who had a Medicare Annual Wellness Visit today. Kalanie is Retired and lives with their spouse. she has no children, but she keeps her 5 great nieces and nephews Monday through Thursday (93 months old to 4 years). she reports that she is socially active and does interact with friends/family regularly. she is markedly physically active and enjoys painting, creating crafts with recycled goods, cocheting.  Patient Care Team: Ezma Rehm, Coralee Pesa, NP as PCP - General (Nurse Practitioner)     02/01/2022    8:58 AM 09/30/2016    1:35 PM  Advanced Directives  Does Patient Have a Medical Advance Directive? No No  Would patient like information on creating a medical advance directive? No - Patient declined     Hospital Utilization Over the Past 12 Months: # of hospitalizations or ER visits: 1 # of surgeries: 0  Review of Systems    Patient reports that her overall health is unchanged when compared to last year.  Review of Systems: Negative except neck pain, she is working on getting into a massage therapist to help release her muscles in neck and back  All other systems negative.  Pain Assessment  0/10     Current Medications & Allergies (verified) Allergies as of 02/01/2022       Reactions   Prednisone    REACTION: nausea   Risedronate Sodium    REACTION: nausea, heartburn, chest pain        Medication List        Accurate as of February 01, 2022  9:51 AM. If you have any questions, ask your nurse or doctor.          AMBULATORY NON FORMULARY MEDICATION Soft neck brace for neuralgia.   cetirizine 10 MG tablet Commonly known as: ZYRTEC 1 tablet as needed   cyclobenzaprine 5 MG  tablet Commonly known as: FLEXERIL Take 1 tablet (5 mg total) by mouth at bedtime. As needed for neck pain and spasms   DULoxetine 30 MG capsule Commonly known as: Cymbalta Take 1 capsule (30 mg total) by mouth daily.   DULoxetine 60 MG capsule Commonly known as: Cymbalta Take 1 capsule (60 mg total) by mouth daily.   EpiPen 2-Pak 0.3 mg/0.3 mL Soaj injection Generic drug: EPINEPHrine Inject 0.3 mg into the muscle as needed for anaphylaxis.   famotidine 40 MG tablet Commonly known as: PEPCID Take 40 mg by mouth daily.   Flonase Sensimist 27.5 MCG/SPRAY nasal spray Generic drug: fluticasone Place 2 sprays into the nose daily.   guaifenesin 100 MG/5ML syrup Commonly known as: ROBITUSSIN Take 200 mg by mouth 3 (three) times daily as needed for cough.   hydrOXYzine 50 MG tablet Commonly known as: ATARAX Take 1 tablet (50 mg total) by mouth at bedtime as needed. For anxiety and sleep.   ibuprofen 200 MG tablet Commonly known as: ADVIL Take 200 mg by mouth every 6 (six) hours as needed.   linaclotide 72 MCG capsule Commonly known as: Linzess Take 1 capsule (72 mcg total) by mouth daily before breakfast.   omeprazole 40 MG capsule Commonly known as: PRILOSEC TAKE 1 CAPSULE BY MOUTH EVERY DAY 20 TO 30 MINS BEFORE BREAKFAST   OVER THE COUNTER MEDICATION Take 1  tablet by mouth every other day. Clear Lax Gas Relief   OVER THE COUNTER MEDICATION Take 1 tablet by mouth as needed. Pain reliever 500 mg OTC   rizatriptan 10 MG tablet Commonly known as: Maxalt Take 1 tablet (10 mg total) by mouth as needed for migraine. May repeat in 2 hours if needed. Do not take more than 2 in 24 hours.        History (reviewed): Past Medical History:  Diagnosis Date   Allergy    Asthma    Environmental allergies    GERD (gastroesophageal reflux disease)    Osteoporosis    PONV (postoperative nausea and vomiting)    Past Surgical History:  Procedure Laterality Date   ROOT CANAL      Family History  Problem Relation Age of Onset   Heart attack Father    Breast cancer Mother    Osteoporosis Mother    Colon cancer Neg Hx    Stomach cancer Neg Hx    Esophageal cancer Neg Hx    Pancreatic cancer Neg Hx    Liver disease Neg Hx    Social History   Socioeconomic History   Marital status: Married    Spouse name: Not on file   Number of children: Not on file   Years of education: Not on file   Highest education level: Not on file  Occupational History   Not on file  Tobacco Use   Smoking status: Never   Smokeless tobacco: Never  Vaping Use   Vaping Use: Never used  Substance and Sexual Activity   Alcohol use: No   Drug use: No   Sexual activity: Yes    Birth control/protection: None  Other Topics Concern   Not on file  Social History Narrative   Not on file   Social Determinants of Health   Financial Resource Strain: Not on file  Food Insecurity: Not on file  Transportation Needs: Not on file  Physical Activity: Not on file  Stress: Not on file  Social Connections: Not on file    Activities of Daily Living    02/01/2022    8:59 AM  In your present state of health, do you have any difficulty performing the following activities:  Hearing? 1  Comment somewhat  Vision? 1  Comment sometimes at night  Difficulty concentrating or making decisions? 0  Walking or climbing stairs? 0  Dressing or bathing? 0  Doing errands, shopping? 0  Preparing Food and eating ? N  Using the Toilet? N  In the past six months, have you accidently leaked urine? N  Do you have problems with loss of bowel control? N  Managing your Medications? N  Managing your Finances? N  Housekeeping or managing your Housekeeping? N    Patient Education/Literacy    Exercise Current Exercise Habits: Home exercise routine, Type of exercise: walking, Time (Minutes): 30, Frequency (Times/Week): 4, Weekly Exercise (Minutes/Week): 120, Intensity: Mild, Exercise limited by: None  identified  Diet Patient reports consuming 2 meals a day and 3 snack(s) a day Patient reports that her primary diet is: Regular Patient reports that she does have regular access to food.   Depression Screen    02/01/2022    8:57 AM 12/31/2021    3:40 PM 10/09/2020   10:50 AM 03/24/2020   11:19 AM  PHQ 2/9 Scores  PHQ - 2 Score 0 0 0 0     Fall Risk    02/01/2022    8:57 AM  12/31/2021    3:40 PM 10/09/2020   10:50 AM 03/24/2020   11:19 AM  Fall Risk   Falls in the past year? 0 0 0 0  Number falls in past yr: 0 0 0   Injury with Fall? 0 0 0   Risk for fall due to : No Fall Risks No Fall Risks No Fall Risks No Fall Risks  Follow up Falls evaluation completed Falls evaluation completed Falls evaluation completed Falls evaluation completed     Objective:   BP 128/74   Pulse 71   Temp 98.1 F (36.7 C)   Ht 5' 1.5" (1.562 m)   Wt 119 lb 3.2 oz (54.1 kg)   LMP 04/04/1982   BMI 22.16 kg/m   Last Weight  Most recent update: 02/01/2022  8:57 AM    Weight  54.1 kg (119 lb 3.2 oz)             Body mass index is 22.16 kg/m.  Hearing/Vision  Antania did not have difficulty with hearing/understanding during the face-to-face interview Annaleigh did not have difficulty with her vision during the face-to-face interview Reports that she has had a formal eye exam by an eye care professional within the past year Reports that she has not had a formal hearing evaluation within the past year  Cognitive Function:     No data to display          Normal Cognitive Function Screening: Yes (Normal:0-7, Significant for Dysfunction: >8)  Immunization & Health Maintenance Record Immunization History  Administered Date(s) Administered   Fluad Quad(high Dose 65+) 03/24/2020, 10/09/2020   Influenza, High Dose Seasonal PF 01/18/2019   PFIZER(Purple Top)SARS-COV-2 Vaccination 03/27/2019, 04/21/2019, 08/09/2019, 01/03/2020   Pneumococcal Polysaccharide-23 01/18/2019, 03/24/2020,  08/21/2020   Tdap 10/09/2020   Zoster Recombinat (Shingrix) 09/16/2018, 12/06/2018    Health Maintenance  Topic Date Due   Medicare Annual Wellness (AWV)  Never done   DEXA SCAN  Never done   MAMMOGRAM  01/25/2021   Pneumonia Vaccine 16+ Years old (2 - PCV) 08/21/2021   INFLUENZA VACCINE  09/11/2021   COVID-19 Vaccine (5 - 2023-24 season) 10/12/2021   COLONOSCOPY (Pts 45-94yr Insurance coverage will need to be confirmed)  05/25/2030   DTaP/Tdap/Td (2 - Td or Tdap) 10/10/2030   Zoster Vaccines- Shingrix  Completed   HPV VACCINES  Aged Out   Hepatitis C Screening  Discontinued       Assessment  This is a routine wellness examination for DJuanito Doom  Health Maintenance: Due or Overdue Health Maintenance Due  Topic Date Due   Medicare Annual Wellness (AWV)  Never done   DEXA SCAN  Never done   MAMMOGRAM  01/25/2021   Pneumonia Vaccine 69 Years old (2 - PCV) 08/21/2021   INFLUENZA VACCINE  09/11/2021   COVID-19 Vaccine (5 - 2023-24 season) 10/12/2021    DJuanito Doomdoes not need a referral for Community Assistance: Care Management:   no Social Work:    no Prescription Assistance:  no Nutrition/Diabetes Education:  no   Plan:  Personalized Goals  Goals Addressed   None    Personalized Health Maintenance & Screening Recommendations  Pneumococcal vaccine  Influenza vaccine Screening mammography Bone densitometry screening DEXA  Lung Cancer Screening Recommended: no (Low Dose CT Chest recommended if Age 69-80years, 30 pack-year currently smoking OR have quit w/in past 15 years) Hepatitis C Screening recommended: no HIV Screening recommended: no  Advanced Directives: Written information was not given per  the patient's request.  Referrals & Orders Orders Placed This Encounter  Procedures   DG Bone Density   MM 3D SCREEN BREAST BILATERAL   Flu vaccine HIGH DOSE PF (Fluzone High dose)   Pfizer Fall 2023 Covid-19 Vaccine 71yr and older     Follow-up Plan Follow-up with Drusilla Wampole, SCoralee Pesa NP as planned   I have personally reviewed and noted the following in the patient's chart:   Medical and social history Use of alcohol, tobacco or illicit drugs  Current medications and supplements Functional ability and status Nutritional status Physical activity Advanced directives List of other physicians Hospitalizations, surgeries, and ER visits in previous 12 months Vitals Screenings to include cognitive, depression, and falls Referrals and appointments  In addition, I have reviewed and discussed with patient certain preventive protocols, quality metrics, and best practice recommendations. A written personalized care plan for preventive services as well as general preventive health recommendations were provided to patient.     SOrma Render DNP, AGNP-c   02/01/2022

## 2022-02-01 NOTE — Patient Instructions (Addendum)
I am so proud of you for all of your enthusiasm to help yourself and your family maintain a healthy lifestyle! You are an inspiration to others!!

## 2022-02-16 NOTE — Telephone Encounter (Signed)
done

## 2022-04-05 LAB — HM MAMMOGRAPHY

## 2022-04-09 ENCOUNTER — Encounter: Payer: Self-pay | Admitting: Nurse Practitioner

## 2022-04-09 ENCOUNTER — Ambulatory Visit (INDEPENDENT_AMBULATORY_CARE_PROVIDER_SITE_OTHER): Payer: Medicare Other | Admitting: Nurse Practitioner

## 2022-04-09 VITALS — BP 120/70 | HR 84 | Temp 99.2°F | Wt 119.0 lb

## 2022-04-09 DIAGNOSIS — J069 Acute upper respiratory infection, unspecified: Secondary | ICD-10-CM

## 2022-04-09 DIAGNOSIS — R202 Paresthesia of skin: Secondary | ICD-10-CM | POA: Diagnosis not present

## 2022-04-09 DIAGNOSIS — B9689 Other specified bacterial agents as the cause of diseases classified elsewhere: Secondary | ICD-10-CM | POA: Diagnosis not present

## 2022-04-09 MED ORDER — PROMETHAZINE-DM 6.25-15 MG/5ML PO SYRP: ORAL_SOLUTION | ORAL | 0 refills | Status: DC

## 2022-04-09 MED ORDER — AMOXICILLIN-POT CLAVULANATE 875-125 MG PO TABS: 1.0000 | ORAL_TABLET | Freq: Two times a day (BID) | ORAL | 0 refills | Status: DC

## 2022-04-09 NOTE — Assessment & Plan Note (Signed)
Patient presenting with symptoms consistent with URI. She has had negative covid, flu, strep, and rsv testing. She has been using supportive therapies with no improvement of symptoms. Given the severity of her symptoms at this time, I feel that it is reasonable to start oral antibiotics for management.  Plan: - start augmentin twice a day for 5 days - continue flonase and tessalon pearles - start promethazine DM for cough, particularly at bedtime  - continue with warm salt water gargles - may use tylenol and ibuprofen for pain and fevers.  - follow-up if symptoms do not improve in the next couple of days

## 2022-04-09 NOTE — Patient Instructions (Signed)
I want you to rest as much as possible and be sure you are drinking plenty of water.  Keep gargling in warm salt water.

## 2022-04-09 NOTE — Assessment & Plan Note (Signed)
The patient has numbness in the fingertips, with a suspected vitamin B12 deficiency given historic labs. No other symptoms present today.  Plan: - Recommend starting an over-the-counter vitamin B12 supplement. - Plan to recheck vitamin B12 levels in 3 months.

## 2022-04-09 NOTE — Progress Notes (Signed)
Orma Render, DNP, AGNP-c [place], [place] [address] [phone]  Subjective:   Karina Small is a 72 y.o. female presents to day for evaluation of: The patient presents today with concerns regarding throat and respiratory symptoms. She reports that her initial symptom was a severe sore throat, which has been overshadowed by the onset of a productive cough. She describes the cough as "super bad," with constant production of yellow phlegm, which occasionally becomes lodged in her throat.  She is experiencing concurrent headaches, body aches, and ear pain, and notes difficulty swallowing for the past three to four days due to the sore throat's severity. Additionally, she reports a sensation of phlegm becoming trapped behind her sinuses, leading to gagging. She has not had any fevers that she is aware of. She has tested negative for COVID, flu, and strep at the urgent care. She has been taking tessalon and cepacol for symptom management.   The patient also reports intermittent numbness in the fingertips without a discernible pattern or timing, and observes temperature discrepancies between her hands, with one feeling cold and the other warm. She is currently undergoing physical therapy for neck issues and inquires if there could be a connection between the therapy and her finger numbness.  PMH, Medications, and Allergies reviewed and updated in chart as appropriate.   ROS negative except for what is listed in HPI. Objective:  BP 120/70   Pulse 84   Temp 99.2 F (37.3 C)   Wt 119 lb (54 kg)   LMP 04/04/1982   SpO2 98%   BMI 22.12 kg/m  Physical Exam Vitals and nursing note reviewed.  Constitutional:      Appearance: She is ill-appearing.  HENT:     Head: Normocephalic.     Right Ear: Tenderness present. A middle ear effusion is present.     Left Ear: Tenderness present. A middle ear effusion is present.     Nose: Congestion and  rhinorrhea present.     Mouth/Throat:     Mouth: Mucous membranes are moist.     Pharynx: Posterior oropharyngeal erythema present.  Cardiovascular:     Rate and Rhythm: Normal rate and regular rhythm.     Pulses: Normal pulses.     Heart sounds: Normal heart sounds.  Pulmonary:     Effort: Pulmonary effort is normal.     Breath sounds: Wheezing present.  Musculoskeletal:     Cervical back: Tenderness present.     Right lower leg: No edema.     Left lower leg: No edema.  Lymphadenopathy:     Cervical: Cervical adenopathy present.  Skin:    General: Skin is warm and dry.     Capillary Refill: Capillary refill takes less than 2 seconds.  Neurological:     General: No focal deficit present.     Mental Status: She is alert and oriented to person, place, and time.  Psychiatric:        Mood and Affect: Mood normal.           Assessment & Plan:   Problem List Items Addressed This Visit     Bacterial URI - Primary    Patient presenting with symptoms consistent with URI. She has had negative covid, flu, strep, and rsv testing. She has been using supportive therapies with no improvement of symptoms. Given the severity of her symptoms at this time, I feel that it is reasonable to start oral antibiotics for management.  Plan: -  start augmentin twice a day for 5 days - continue flonase and tessalon pearles - start promethazine DM for cough, particularly at bedtime  - continue with warm salt water gargles - may use tylenol and ibuprofen for pain and fevers.  - follow-up if symptoms do not improve in the next couple of days      Relevant Medications   amoxicillin-clavulanate (AUGMENTIN) 875-125 MG tablet   promethazine-dextromethorphan (PROMETHAZINE-DM) 6.25-15 MG/5ML syrup   Paresthesia of hand, bilateral    The patient has numbness in the fingertips, with a suspected vitamin B12 deficiency given historic labs. No other symptoms present today.  Plan: - Recommend starting an  over-the-counter vitamin B12 supplement. - Plan to recheck vitamin B12 levels in 3 months.         Orma Render, DNP, AGNP-c 04/09/2022  5:50 PM    History, Medications, Surgery, SDOH, and Family History reviewed and updated as appropriate.

## 2022-09-04 ENCOUNTER — Ambulatory Visit (INDEPENDENT_AMBULATORY_CARE_PROVIDER_SITE_OTHER): Payer: Medicare Other | Admitting: Family Medicine

## 2022-09-04 ENCOUNTER — Encounter: Payer: Self-pay | Admitting: Family Medicine

## 2022-09-04 VITALS — BP 98/58 | HR 88 | Temp 98.6°F | Ht 61.0 in | Wt 118.2 lb

## 2022-09-04 DIAGNOSIS — R0981 Nasal congestion: Secondary | ICD-10-CM | POA: Diagnosis not present

## 2022-09-04 DIAGNOSIS — H6123 Impacted cerumen, bilateral: Secondary | ICD-10-CM | POA: Diagnosis not present

## 2022-09-04 DIAGNOSIS — L309 Dermatitis, unspecified: Secondary | ICD-10-CM

## 2022-09-04 NOTE — Patient Instructions (Signed)
  Consider rechecking a COVID test if your symptoms persist--especially if worsening congestion, cough, fever.  It can take up to 3 days for a home COVID test to be positive.  Drink plenty of water. Use sudafed (decongestant) as needed for ear pain/plugging and sinus congestion.  Continue zyrtec daily. Stop the alka selzer. Consider adding flonase, 2 sprays into each nostril every day, if you don't see improvement in the next 1-2 days. This needs to be used regularly (not just occasionally).  If your allergies are not well controlled, Flonase daily is a great medicine to use daily (especially during peak seasons, spring/fall).

## 2022-09-04 NOTE — Progress Notes (Signed)
Chief Complaint  Patient presents with   Dry throat    Started Monday evening with dry throat and L ear feeling full, R ear pain and headache. Took a covid test yesterday and it was negative. Has some chest tightness. No cough or fever but head feels "tight". Also left wrist has an itchy place where her watch was, can you take a look at it?   "I think my allergies are messing back up again" She started with R earache, but now head and both ears are stopped up. The right ear no longer hurts, but now the left one is plugged and hurting, hearing is muffled. Denies runny nose--is congestion. Denies cough. She has a dry, scratchy sore throat.  Usually for her allergies she takes zyrtec, or alka selzer cold. She takes these just prn, but has been taking them since Monday.  She took zyrtec this morning, no alka selzer yet today. Had negative COVID test yesterday.  Previously has seen ENT (Dr. Haroldine Laws) for cerumen removal.  Hasn't had ears cleaned since he retired.  She has narrow canals.   PMH, PSH, SH reviewed Asthma, allergies, GERD, osteoporosis  Outpatient Encounter Medications as of 09/04/2022  Medication Sig Note   Chlorphen-Phenyleph-ASA (ALKA-SELTZER PLUS COLD) 2-7.8-325 MG TBEF Take 2 tablets by mouth as needed. 09/04/2022: Took last 8pm last night   EPINEPHrine (EPIPEN 2-PAK) 0.3 mg/0.3 mL IJ SOAJ injection Inject 0.3 mg into the muscle as needed for anaphylaxis. (Patient not taking: Reported on 04/09/2022) 09/04/2022: Out of date   famotidine (PEPCID) 40 MG tablet Take 40 mg by mouth daily. (Patient not taking: Reported on 09/04/2022) 09/04/2022: As  needed   linaclotide (LINZESS) 72 MCG capsule Take 1 capsule (72 mcg total) by mouth daily before breakfast. (Patient not taking: Reported on 02/01/2022) 09/04/2022: As needed   OVER THE COUNTER MEDICATION Take 1 tablet by mouth every other day. Clear Lax Gas Relief (Patient not taking: Reported on 09/04/2022) 09/04/2022: As needed   rizatriptan  (MAXALT) 10 MG tablet Take 1 tablet (10 mg total) by mouth as needed for migraine. May repeat in 2 hours if needed. Do not take more than 2 in 24 hours. (Patient not taking: Reported on 02/01/2022) 09/04/2022: As needed   [DISCONTINUED] AMBULATORY NON FORMULARY MEDICATION Soft neck brace for neuralgia.    [DISCONTINUED] amoxicillin-clavulanate (AUGMENTIN) 875-125 MG tablet Take 1 tablet by mouth 2 (two) times daily.    [DISCONTINUED] cetirizine (ZYRTEC) 10 MG tablet 1 tablet as needed    [DISCONTINUED] cyclobenzaprine (FLEXERIL) 5 MG tablet Take 1 tablet (5 mg total) by mouth at bedtime. As needed for neck pain and spasms    [DISCONTINUED] DULoxetine (CYMBALTA) 30 MG capsule Take 1 capsule (30 mg total) by mouth daily.    [DISCONTINUED] DULoxetine (CYMBALTA) 60 MG capsule Take 1 capsule (60 mg total) by mouth daily.    [DISCONTINUED] fluticasone (FLONASE SENSIMIST) 27.5 MCG/SPRAY nasal spray Place 2 sprays into the nose daily.    [DISCONTINUED] guaifenesin (ROBITUSSIN) 100 MG/5ML syrup Take 200 mg by mouth 3 (three) times daily as needed for cough. (Patient not taking: Reported on 01/15/2022)    [DISCONTINUED] hydrOXYzine (ATARAX) 50 MG tablet Take 1 tablet (50 mg total) by mouth at bedtime as needed. For anxiety and sleep.    [DISCONTINUED] ibuprofen (ADVIL) 200 MG tablet Take 200 mg by mouth every 6 (six) hours as needed.    [DISCONTINUED] omeprazole (PRILOSEC) 40 MG capsule TAKE 1 CAPSULE BY MOUTH EVERY DAY 20 TO 30 MINS BEFORE  BREAKFAST (Patient not taking: Reported on 02/01/2022)    [DISCONTINUED] OVER THE COUNTER MEDICATION Take 1 tablet by mouth as needed. Pain reliever 500 mg OTC    [DISCONTINUED] promethazine-dextromethorphan (PROMETHAZINE-DM) 6.25-15 MG/5ML syrup Take 2.42mL- 5mL up to 4 times a day as needed for cough.    No facility-administered encounter medications on file as of 09/04/2022.   Allergies  Allergen Reactions   Prednisone     REACTION: nausea   Risedronate Sodium      REACTION: nausea, heartburn, chest pain   ROS: No fever or chills. No known sick contacts. No n/v/d. Spot of rash on L wrist, where her watch had been, a little itchy, just recently noticed.   PHYSICAL EXAM:  BP (!) 98/58   Pulse 88   Temp 98.6 F (37 C) (Tympanic)   Ht 5\' 1"  (1.549 m)   Wt 118 lb 3.2 oz (53.6 kg)   LMP 04/04/1982   BMI 22.33 kg/m   Well appearing, pleasant female, in no distress.  Slightly scratchy voice. EAC's are narrow.  Both are occluded with cerumen. Nasal mucosa is mild-mod edematous, L>R, pale mucosa, no drainage.   OP is clear. Sinuses nontender Neck: no lymphadenopathy or mass Heart: regular rate and rhythm Lungs: clear bilaterally Extremities: no edema Psych: normal mood, affect, hygiene and grooming Neuro: alert and oriented, cranial nerves intact, normal gait. Skin: small area that was slightly dry, round, at L wrist.    Attempted ear lavage.  Some cerumen removed from right ear, but pt asked to stop, due to discomfort. Will refer to ENT for cerumen removal.   ASSESSMENT/PLAN:  Bilateral impacted cerumen - pt didn't tolerate attempt at ear lavage. Will refer to ENT due to bilateral cerumen, narrow canals. ETD may contribute to muffled hearing also - Plan: Ambulatory referral to ENT  Nasal congestion - Ddx discussed, virus/cold vs allergies.  To continue zyrtec daily, sudafed prn. consider flonase if persistent congestion/allergies  Dermatitis - very mild, L wrist. Can use hydrocortisone cream prn for a few days  Consider rechecking a COVID test if your symptoms persist--especially if worsening congestion, cough, fever.  It can take up to 3 days for a home COVID test to be positive.  Drink plenty of water. Use sudafed (decongestant) as needed for ear pain/plugging and sinus congestion.  Continue zyrtec daily. Stop the alka selzer. Consider adding flonase, 2 sprays into each nostril every day, if you don't see improvement in the next 1-2  days. This needs to be used regularly (not just occasionally).  If your allergies are not well controlled, Flonase daily is a great medicine to use daily (especially during peak seasons, spring/fall).

## 2022-09-26 ENCOUNTER — Encounter: Payer: Self-pay | Admitting: Nurse Practitioner

## 2022-09-26 ENCOUNTER — Ambulatory Visit (INDEPENDENT_AMBULATORY_CARE_PROVIDER_SITE_OTHER): Payer: Medicare Other | Admitting: Nurse Practitioner

## 2022-09-26 VITALS — BP 116/80 | HR 78 | Wt 117.2 lb

## 2022-09-26 DIAGNOSIS — J0140 Acute pansinusitis, unspecified: Secondary | ICD-10-CM

## 2022-09-26 MED ORDER — AMOXICILLIN-POT CLAVULANATE 875-125 MG PO TABS: 1.0000 | ORAL_TABLET | Freq: Two times a day (BID) | ORAL | 0 refills | Status: DC

## 2022-09-26 NOTE — Progress Notes (Signed)
  Karina Eth, DNP, AGNP-c [facility] [address] [place], [place] [address] [phone]   ACUTE VISIT- ESTABLISHED PATIENT  Blood pressure 116/80, pulse 78, weight 117 lb 3.2 oz (53.2 kg), last menstrual period 04/04/1982.  Subjective:  HPI Karina Small is a 72 y.o. female presents to day for evaluation of acute concern(s).   Karina Small reports concerns with 2 weeks of right ear pressure, headache, sinus pain and pressure, sensation of heaviness in the chest.  She tells me her ears feel "stopped up" she does endorse some pain in the right ear.  She was seen fairly recently for removal of cerumen impaction however they were unable to completely clear the ear canal.  She denies cough at this time.  She is having no fevers.  ROS negative except for what is listed in HPI. History, Medications, Surgery, SDOH, and Family History reviewed and updated as appropriate.  Objective:  Physical Exam Vitals and nursing note reviewed.  Constitutional:      Appearance: She is ill-appearing.  HENT:     Head: Normocephalic.     Right Ear: There is impacted cerumen.     Left Ear: There is impacted cerumen.     Nose: Congestion present.     Right Sinus: Frontal sinus tenderness present.     Left Sinus: Frontal sinus tenderness present.     Mouth/Throat:     Mouth: Mucous membranes are moist.     Pharynx: Posterior oropharyngeal erythema present.  Cardiovascular:     Rate and Rhythm: Normal rate and regular rhythm.     Pulses: Normal pulses.     Heart sounds: Normal heart sounds.  Pulmonary:     Effort: Pulmonary effort is normal. No respiratory distress.     Breath sounds: Normal breath sounds. No wheezing or rhonchi.  Lymphadenopathy:     Cervical: Cervical adenopathy present.  Skin:    General: Skin is warm and dry.     Capillary Refill: Capillary refill takes less than 2 seconds.  Neurological:     General: No focal deficit present.     Mental Status: She is  alert.  Psychiatric:        Mood and Affect: Mood normal.         Assessment & Plan:   Problem List Items Addressed This Visit     Acute non-recurrent pansinusitis - Primary    Symptoms presentation consistent with nonrecurrent acute sinusitis.  Unfortunately the ear canals are impacted with cerumen therefore clear observation of the TM is not available at this time.  Given the current symptoms she has we will go ahead and begin treatment with antibiotic therapy.  I do not feel that cerumen impaction at this time would be beneficial given the pain and pressure she is experiencing.  We will plan to try this at a later time.      Relevant Medications   amoxicillin-clavulanate (AUGMENTIN) 875-125 MG tablet      Time: 20 minutes, >50% spent counseling, care coordination, chart review, and documentation.   Karina Eth, DNP, AGNP-c

## 2022-09-26 NOTE — Patient Instructions (Signed)
Place a few drops of hydrogen peroxide in each ear and let it set for 5 minutes and then drain to help prevent the wax building up.

## 2022-10-08 DIAGNOSIS — J0141 Acute recurrent pansinusitis: Secondary | ICD-10-CM | POA: Insufficient documentation

## 2022-10-08 DIAGNOSIS — J0140 Acute pansinusitis, unspecified: Secondary | ICD-10-CM | POA: Insufficient documentation

## 2022-10-08 NOTE — Assessment & Plan Note (Signed)
Symptoms presentation consistent with nonrecurrent acute sinusitis.  Unfortunately the ear canals are impacted with cerumen therefore clear observation of the TM is not available at this time.  Given the current symptoms she has we will go ahead and begin treatment with antibiotic therapy.  I do not feel that cerumen impaction at this time would be beneficial given the pain and pressure she is experiencing.  We will plan to try this at a later time.

## 2022-10-12 ENCOUNTER — Emergency Department (HOSPITAL_COMMUNITY): Payer: Medicare Other

## 2022-10-12 ENCOUNTER — Other Ambulatory Visit: Payer: Self-pay

## 2022-10-12 ENCOUNTER — Encounter (HOSPITAL_COMMUNITY): Payer: Self-pay

## 2022-10-12 ENCOUNTER — Emergency Department (HOSPITAL_COMMUNITY)
Admission: EM | Admit: 2022-10-12 | Discharge: 2022-10-12 | Disposition: A | Discharge status: Home / Self Care | Payer: Medicare Other | Attending: Emergency Medicine | Admitting: Emergency Medicine

## 2022-10-12 DIAGNOSIS — R079 Chest pain, unspecified: Secondary | ICD-10-CM | POA: Insufficient documentation

## 2022-10-12 LAB — BASIC METABOLIC PANEL
Anion gap: 10 (ref 5–15)
BUN: 11 mg/dL (ref 8–23)
CO2: 25 mmol/L (ref 22–32)
Calcium: 9.3 mg/dL (ref 8.9–10.3)
Chloride: 106 mmol/L (ref 98–111)
Creatinine, Ser: 0.75 mg/dL (ref 0.44–1.00)
GFR, Estimated: >60 mL/min (ref >60)
Glucose, Bld: 99 mg/dL (ref 70–99)
Potassium: 3.2 mmol/L — ABNORMAL LOW (ref 3.5–5.1)
Sodium: 141 mmol/L (ref 135–145)

## 2022-10-12 LAB — TROPONIN I (HIGH SENSITIVITY)
Troponin I (High Sensitivity): 3 ng/L (ref <18)
Troponin I (High Sensitivity): 3 ng/L (ref <18)

## 2022-10-12 LAB — HEPATIC FUNCTION PANEL
ALT: 20 U/L (ref 0–44)
AST: 18 U/L (ref 15–41)
Albumin: 3.7 g/dL (ref 3.5–5.0)
Alkaline Phosphatase: 40 U/L (ref 38–126)
Bilirubin, Direct: <0.1 mg/dL (ref 0.0–0.2)
Total Bilirubin: 0.8 mg/dL (ref 0.3–1.2)
Total Protein: 6.4 g/dL — ABNORMAL LOW (ref 6.5–8.1)

## 2022-10-12 LAB — CBC
HCT: 33.2 % — ABNORMAL LOW (ref 36.0–46.0)
Hemoglobin: 10.9 g/dL — ABNORMAL LOW (ref 12.0–15.0)
MCH: 29 pg (ref 26.0–34.0)
MCHC: 32.8 g/dL (ref 30.0–36.0)
MCV: 88.3 fL (ref 80.0–100.0)
Platelets: 268 10*3/uL (ref 150–400)
RBC: 3.76 MIL/uL — ABNORMAL LOW (ref 3.87–5.11)
RDW: 12 % (ref 11.5–15.5)
WBC: 4.9 10*3/uL (ref 4.0–10.5)
nRBC: 0 % (ref 0.0–0.2)

## 2022-10-12 MED ORDER — POTASSIUM CHLORIDE CRYS ER 20 MEQ PO TBCR
40.0000 meq | EXTENDED_RELEASE_TABLET | Freq: Once | ORAL | Status: AC
  Administered 2022-10-12: 40 meq via ORAL
  Filled 2022-10-12: qty 2

## 2022-10-12 MED ORDER — KETOROLAC TROMETHAMINE 15 MG/ML IJ SOLN
15.0000 mg | Freq: Once | INTRAMUSCULAR | Status: AC
  Administered 2022-10-12: 15 mg via INTRAVENOUS
  Filled 2022-10-12: qty 1

## 2022-10-12 NOTE — Discharge Instructions (Signed)
You are seen today for chest pain.  This is likely related to your prior COVID.  Your ultrasound was normal.  Your labs and EKG did not show signs of problems with your heart.  If you are having new or worsening chest pain, shortness of breath, sweating, nausea, I recommend they return to be evaluated.

## 2022-10-12 NOTE — ED Provider Notes (Signed)
[place] EMERGENCY DEPARTMENT AT [facility] Provider Note   CSN: [id number] Arrival date & time: 10/12/22  1439     History {Add pertinent medical, surgical, social history, OB history to HPI:1} Chief Complaint  Patient presents with   Chest Pain   Arm Pain    Karina Small is a 72 y.o. female.   Chest Pain Arm Pain Associated symptoms include chest pain.       Home Medications Prior to Admission medications   Medication Sig Start Date End Date Taking? Authorizing Provider  amoxicillin-clavulanate (AUGMENTIN) 875-125 MG tablet Take 1 tablet by mouth 2 (two) times daily. 09/26/22   Tollie Eth, NP  Chlorphen-Phenyleph-ASA (ALKA-SELTZER PLUS COLD) 2-7.8-325 MG TBEF Take 2 tablets by mouth as needed. Patient not taking: Reported on 09/26/2022    [provider]  EPINEPHrine (EPIPEN 2-PAK) 0.3 mg/0.3 mL IJ SOAJ injection Inject 0.3 mg into the muscle as needed for anaphylaxis. Patient not taking: Reported on 04/09/2022    [provider]  famotidine (PEPCID) 40 MG tablet Take 40 mg by mouth daily. Patient not taking: Reported on 09/04/2022    [provider]  linaclotide Karlene Einstein) 72 MCG capsule Take 1 capsule (72 mcg total) by mouth daily before breakfast. 05/24/20   Rachael Fee, MD  OVER THE COUNTER MEDICATION Take 1 tablet by mouth every other day. Clear Lax Gas Relief Patient not taking: Reported on 09/04/2022    [provider]  rizatriptan (MAXALT) 10 MG tablet Take 1 tablet (10 mg total) by mouth as needed for migraine. May repeat in 2 hours if needed. Do not take more than 2 in 24 hours. 01/15/22   Tollie Eth, NP      Allergies    Prednisone and Risedronate sodium    Review of Systems   Review of Systems  Cardiovascular:  Positive for chest pain.    Physical Exam Updated Vital Signs BP (!) 156/78 (BP Location: Right Arm)   Pulse 96   Temp 98.2 F (36.8 C) (Oral)   Resp 16   LMP 04/04/1982   SpO2 100%   Physical Exam  ED Results / Procedures / Treatments   Labs (all labs ordered are listed, but only abnormal results are displayed) Labs Reviewed  CBC - Abnormal; Notable for the following components:      Result Value   RBC 3.76 (*)    Hemoglobin 10.9 (*)    HCT 33.2 (*)    All other components within normal limits  BASIC METABOLIC PANEL  TROPONIN I (HIGH SENSITIVITY)    EKG EKG Interpretation Date/Time:  Saturday October 12 2022 14:47:48 EDT Ventricular Rate:  87 PR Interval:  146 QRS Duration:  78 QT Interval:  354 QTC Calculation: 425 R Axis:   54  Text Interpretation: Normal sinus rhythm Normal ECG When compared with ECG of 27-Jul-2016 13:00, PREVIOUS ECG IS PRESENT Similar to previous Confirmed by Coralee Pesa [phone]) on 10/12/2022 3:56:16 PM  Radiology No results found.  Procedures Procedures  {Document cardiac monitor, telemetry assessment procedure when appropriate:1}  Medications Ordered in ED Medications - No data to display  ED Course/ Medical Decision Making/ A&P   {   Click here for ABCD2, HEART and other calculatorsREFRESH Note before signing :1}                              Medical Decision Making Amount and/or Complexity of Data  Reviewed Labs: ordered. Radiology: ordered.   ***  {Document critical care time when appropriate:1} {Document review of labs and clinical decision tools ie heart score, Chads2Vasc2 etc:1}  {Document your independent review of radiology images, and any outside records:1} {Document your discussion with family members, caretakers, and with consultants:1} {Document social determinants of health affecting pt's care:1} {Document your decision making why or why not admission, treatments were needed:1} Final Clinical Impression(s) / ED Diagnoses Final diagnoses:  None    Rx / DC Orders ED Discharge Orders     None

## 2022-10-12 NOTE — ED Triage Notes (Signed)
Pt reports right arm pain and indigestion feeling in her chest for the past week. Pt tested positive for COVID 2 weeks ago. Hr initially 150 in triage and pt felt tightness in her chest, lasting about 20 seconds.

## 2022-11-29 ENCOUNTER — Encounter (INDEPENDENT_AMBULATORY_CARE_PROVIDER_SITE_OTHER): Payer: Self-pay

## 2022-11-29 ENCOUNTER — Ambulatory Visit (INDEPENDENT_AMBULATORY_CARE_PROVIDER_SITE_OTHER): Payer: Medicare Other | Admitting: Otolaryngology

## 2022-11-29 ENCOUNTER — Ambulatory Visit (INDEPENDENT_AMBULATORY_CARE_PROVIDER_SITE_OTHER): Payer: Medicare Other | Admitting: Audiology

## 2022-11-29 VITALS — Ht 61.0 in | Wt 115.0 lb

## 2022-11-29 DIAGNOSIS — H900 Conductive hearing loss, bilateral: Secondary | ICD-10-CM

## 2022-11-29 DIAGNOSIS — J310 Chronic rhinitis: Secondary | ICD-10-CM | POA: Diagnosis not present

## 2022-11-29 DIAGNOSIS — J343 Hypertrophy of nasal turbinates: Secondary | ICD-10-CM

## 2022-11-29 DIAGNOSIS — H6123 Impacted cerumen, bilateral: Secondary | ICD-10-CM

## 2022-11-29 DIAGNOSIS — H90A31 Mixed conductive and sensorineural hearing loss, unilateral, right ear with restricted hearing on the contralateral side: Secondary | ICD-10-CM | POA: Diagnosis not present

## 2022-11-29 DIAGNOSIS — J342 Deviated nasal septum: Secondary | ICD-10-CM | POA: Diagnosis not present

## 2022-11-29 DIAGNOSIS — R0981 Nasal congestion: Secondary | ICD-10-CM

## 2022-11-29 NOTE — Progress Notes (Signed)
  [address] [place], [place] [address] [phone]  Audiological Evaluation    Name: Karina Small     DOB:   08-12-1950      MRN:   [id number]                                                                                     Service Date: 11/29/2022       Patient was referred today for a hearing evaluation by Dr. Karle Barr.  Symptoms Yes Details  Hearing loss  [x]  Patient reported perceiving hearing loss in both ears.  Tinnitus  [x]  Patient reported experiencing tinnitus in both ears.  Balance problems  [x]  Patient reported intermittent imbalance sensations.  Previous ear surgeries  []  Patient denied any previous ear surgeries.  Amplification  []  Patient denied the use of hearing aids.    Tympanogram: Right ear: Could not obtain seal; middle ear status is unable to be determined at this time. Left ear: Could not obtain seal; middle ear status is unable to be determined at this time.    Hearing Evaluation: The audiogram was completed using conventional audiometric techniques under headphones with good reliability.   The hearing test results indicate: Right ear: Mild hearing loss at 250 Hz gradually sloping to severe mixed hearing loss from [phone] Hz. Left ear: Normal hearing sensitivity from [phone] Hz sloping to moderate sensorineural hearing loss from 2000-8000 Hz. There is a significant difference between the ears for pure tone thresholds.   Speech Recognition Thresholds were obtained at 50 dBHL masked in the right ear and 25 dBHL in the left ear.   Word Recognition Testing was completed using the NU-6 word lists at 85 dBHL with 55 dBHL of masking noise in the right ear and at 65 dBHL in the left ear and the patient scored 92% in the right ear and 92% in the left ear.   Recommendations: Repeat audiogram when changes are perceived or per MD. Patient is a candidate for hearing amplification, consider a hearing aid evaluation. Consider various  tinnitus strategies, including the use of a noise generator, hearing aids, or tinnitus retraining therapy.   Conley Rolls Thelma Lorenzetti, AUD, CCC-A 11/29/22

## 2022-12-01 DIAGNOSIS — J310 Chronic rhinitis: Secondary | ICD-10-CM | POA: Insufficient documentation

## 2022-12-01 DIAGNOSIS — H900 Conductive hearing loss, bilateral: Secondary | ICD-10-CM | POA: Insufficient documentation

## 2022-12-01 DIAGNOSIS — J342 Deviated nasal septum: Secondary | ICD-10-CM | POA: Insufficient documentation

## 2022-12-01 DIAGNOSIS — J343 Hypertrophy of nasal turbinates: Secondary | ICD-10-CM | POA: Insufficient documentation

## 2022-12-01 NOTE — Progress Notes (Unsigned)
Patient ID: Karina Small, female   DOB: 1951/01/19, 72 y.o.   MRN: [id number]  Cc: Hearing loss, chronic nasal congestion, recurrent cerumen impaction  HPI:  Karina Small is an 72 y.o. female who presents today complaining of progressive hearing loss and chronic nasal congestion.  The patient has a history of recurrent cerumen impaction and environmental allergies.  She is currently on Zyrtec and Flonase as needed.  The patient denies any recent sinusitis.  She also denies any significant otalgia, otorrhea, vertigo, facial pain, or fever.  Past Medical History:  Diagnosis Date   Allergy    Asthma    Environmental allergies    GERD (gastroesophageal reflux disease)    Osteoporosis    PONV (postoperative nausea and vomiting)     Past Surgical History:  Procedure Laterality Date   ROOT CANAL      Family History  Problem Relation Age of Onset   Heart attack Father    Breast cancer Mother    Osteoporosis Mother    Colon cancer Neg Hx    Stomach cancer Neg Hx    Esophageal cancer Neg Hx    Pancreatic cancer Neg Hx    Liver disease Neg Hx     Social History:  reports that she has never smoked. She has never used smokeless tobacco. She reports that she does not drink alcohol and does not use drugs.  Allergies:  Allergies  Allergen Reactions   Prednisone     REACTION: nausea   Risedronate Sodium     REACTION: nausea, heartburn, chest pain    Prior to Admission medications   Medication Sig Start Date End Date Taking? Authorizing Provider  linaclotide Karlene Einstein) 72 MCG capsule Take 1 capsule (72 mcg total) by mouth daily before breakfast. 05/24/20  Yes Rachael Fee, MD  OVER THE COUNTER MEDICATION Take 1 tablet by mouth every other day. Clear Lax Gas Relief   Yes [provider]  amoxicillin-clavulanate (AUGMENTIN) 875-125 MG tablet Take 1 tablet by mouth 2 (two) times daily. Patient not taking: Reported on 11/29/2022 09/26/22   Early, Sung Amabile, NP   Chlorphen-Phenyleph-ASA (ALKA-SELTZER PLUS COLD) 2-7.8-325 MG TBEF Take 2 tablets by mouth as needed. Patient not taking: Reported on 09/26/2022    [provider]  EPINEPHrine (EPIPEN 2-PAK) 0.3 mg/0.3 mL IJ SOAJ injection Inject 0.3 mg into the muscle as needed for anaphylaxis. Patient not taking: Reported on 04/09/2022    [provider]  famotidine (PEPCID) 40 MG tablet Take 40 mg by mouth daily. Patient not taking: Reported on 09/04/2022    [provider]  rizatriptan (MAXALT) 10 MG tablet Take 1 tablet (10 mg total) by mouth as needed for migraine. May repeat in 2 hours if needed. Do not take more than 2 in 24 hours. 01/15/22   Tollie Eth, NP   Height 5\' 1"  (1.549 m), weight 52.2 kg, last menstrual period 04/04/1982. Exam: General: Communicates without difficulty, well nourished, no acute distress. Head: Normocephalic, no evidence injury, no tenderness, facial buttresses intact without stepoff. Face/sinus: No tenderness to palpation and percussion. Facial movement is normal and symmetric. Eyes: PERRL, EOMI. No scleral icterus, conjunctivae clear. Neuro: CN II exam reveals vision grossly intact.  No nystagmus at any point of gaze. Ears: Auricles well formed without lesions.  Bilateral cerumen impaction.  Nose: External evaluation reveals normal support and skin without lesions.  Dorsum is intact.  Anterior rhinoscopy reveals congested mucosa over anterior aspect of inferior turbinates and intact  septum.  No purulence noted. Oral:  Oral cavity and oropharynx are intact, symmetric, without erythema or edema.  Mucosa is moist without lesions. Neck: Full range of motion without pain.  There is no significant lymphadenopathy.  No masses palpable.  Thyroid bed within normal limits to palpation.  Parotid glands and submandibular glands equal bilaterally without mass.  Trachea is midline. Neuro:  CN 2-12 grossly intact.    Procedure:  Flexible Nasal Endoscopy: Description:  Risks, benefits, and alternatives of flexible endoscopy were explained to the patient.  Specific mention was made of the risk of throat numbness with difficulty swallowing, possible bleeding from the nose and mouth, and pain from the procedure.  The patient gave oral consent to proceed.  The flexible scope was inserted into the right nasal cavity.  Endoscopy of the interior nasal cavity, superior, inferior, and middle meatus was performed. The sphenoid-ethmoid recess was examined. Edematous mucosa was noted.  No polyp, mass, or lesion was appreciated. Nasal septal deviation noted. Olfactory cleft was clear.  Nasopharynx was clear.  Turbinates were hypertrophied but without mass.  The procedure was repeated on the contralateral side with similar findings.  The patient tolerated the procedure well.    Procedure: Bilateral cerumen disimpaction Anesthesia: None Description: Under the operating microscope, the cerumen is carefully removed with a combination of cerumen currette, alligator forceps, and suction catheters.  After the cerumen is removed, the TMs are noted to be normal.  No mass, erythema, or lesions. The patient tolerated the procedure well.    Her hearing test showed bilateral conductive hearing loss, worse on the right side.  Assessment: 1.  Bilateral cerumen impaction.  After the disimpaction procedure, her ear canals and tympanic membranes are noted to be normal. 2.  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 3.  Chronic rhinitis with nasal mucosal congestion, nasal septal deviation, and bilateral inferior turbinate hypertrophy.  Plan: 1.  Otomicroscopy with bilateral cerumen disimpaction. 2.  The physical exam and nasal endoscopy findings are reviewed with the patient.  Hearing test results also reviewed. 3.  Flonase nasal spray 2 sprays each nostril daily.  The importance of consistent daily use is discussed. 4.  The patient is a candidate for hearing amplification.   Hearing aid options are discussed. 5.  The patient will return for reevaluation in 6 months.  Thelmer Legler W Rozell Kettlewell 12/01/2022, 11:04 AM

## 2023-02-07 ENCOUNTER — Encounter: Payer: Self-pay | Admitting: Nurse Practitioner

## 2023-02-07 ENCOUNTER — Ambulatory Visit (INDEPENDENT_AMBULATORY_CARE_PROVIDER_SITE_OTHER): Payer: Medicare Other | Admitting: Nurse Practitioner

## 2023-02-07 VITALS — BP 102/66 | HR 72 | Ht 62.0 in | Wt 117.2 lb

## 2023-02-07 DIAGNOSIS — J3089 Other allergic rhinitis: Secondary | ICD-10-CM

## 2023-02-07 DIAGNOSIS — Z23 Encounter for immunization: Secondary | ICD-10-CM

## 2023-02-07 DIAGNOSIS — Z0000 Encounter for general adult medical examination without abnormal findings: Secondary | ICD-10-CM | POA: Diagnosis not present

## 2023-02-07 DIAGNOSIS — G5603 Carpal tunnel syndrome, bilateral upper limbs: Secondary | ICD-10-CM | POA: Diagnosis not present

## 2023-02-07 DIAGNOSIS — Z7189 Other specified counseling: Secondary | ICD-10-CM

## 2023-02-07 DIAGNOSIS — J0141 Acute recurrent pansinusitis: Secondary | ICD-10-CM | POA: Diagnosis not present

## 2023-02-07 DIAGNOSIS — R21 Rash and other nonspecific skin eruption: Secondary | ICD-10-CM | POA: Diagnosis not present

## 2023-02-07 DIAGNOSIS — B351 Tinea unguium: Secondary | ICD-10-CM | POA: Insufficient documentation

## 2023-02-07 LAB — LIPID PANEL

## 2023-02-07 MED ORDER — AZITHROMYCIN 250 MG PO TABS
ORAL_TABLET | ORAL | 0 refills | Status: AC
Start: 2023-02-07 — End: 2023-02-12

## 2023-02-07 MED ORDER — BETAMETHASONE DIPROPIONATE 0.05 % EX OINT
TOPICAL_OINTMENT | Freq: Two times a day (BID) | CUTANEOUS | 0 refills | Status: AC | PRN
Start: 2023-02-07 — End: ?

## 2023-02-07 MED ORDER — FLUTICASONE PROPIONATE 50 MCG/ACT NA SUSP: 1.0000 | Freq: Every day | NASAL | 3 refills | Status: AC

## 2023-02-07 MED ORDER — TERBINAFINE HCL 250 MG PO TABS: 250.0000 mg | ORAL_TABLET | Freq: Every day | ORAL | 2 refills | Status: DC

## 2023-02-07 MED ORDER — LEVOCETIRIZINE DIHYDROCHLORIDE 5 MG PO TABS: 5.0000 mg | ORAL_TABLET | Freq: Every evening | ORAL | 3 refills | Status: AC | PRN

## 2023-02-07 MED ORDER — EPINEPHRINE 0.3 MG/0.3ML IJ SOAJ: 0.3000 mg | INTRAMUSCULAR | 3 refills | Status: AC | PRN

## 2023-02-07 NOTE — Assessment & Plan Note (Signed)
Has toenail fungus on the big toe of both feet. Tried ointments and castor oil without significant improvement. Discussed terbinafine as a more effective treatment. Informed that treatment may take several months and the importance of consistent application. Advised soaking the foot in Epsom salt with white vinegar to enhance treatment efficacy. - Prescribe terbinafine for toenail fungus - Advise soaking the foot in Epsom salt with white vinegar

## 2023-02-07 NOTE — Assessment & Plan Note (Signed)
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 Symptoms suggest carpal tunnel syndrome. Discussed the use of wrist braces with metal support during activities and at bedtime. Informed about the potential need for referral to an orthopedist if no improvement in a few months. - Recommend wrist braces with metal support, to be worn during activities and at bedtime - Advise to monitor symptoms and consider referral to orthopedist if no improvement in a few months

## 2023-02-07 NOTE — Patient Instructions (Signed)
  Karina Small , Thank you for taking time to come for your Medicare Wellness Visit. I appreciate your ongoing commitment to your health goals. Please review the following plan we discussed and let me know if I can assist you in the future.   Someone will contact you to help complete your Advanced directives information.   If your rash isn't better please let me know.   These are the goals we discussed:  Goals      Patient Stated     Start my bead business. Making jewelry; making, stringing, and selling.        This is a list of the screening recommended for you and due dates:  Health Maintenance  Topic Date Due   DEXA scan (bone density measurement)  Never done   Pneumonia Vaccine (2 of 2 - PCV) 08/21/2021   Flu Shot  09/12/2022   COVID-19 Vaccine (6 - 2024-25 season) 10/13/2022   Medicare Annual Wellness Visit  02/07/2024   Mammogram  04/05/2024   Colon Cancer Screening  05/25/2030   DTaP/Tdap/Td vaccine (2 - Td or Tdap) 10/10/2030   Zoster (Shingles) Vaccine  Completed   HPV Vaccine  Aged Out   Hepatitis C Screening  Discontinued

## 2023-02-07 NOTE — Assessment & Plan Note (Signed)
Presents with a rash on the arms that started about a month ago. Initially dry and not visible, it became visible and itchy over time. Tried regular lotion, coconut oil, and castor oil mixed with lotion. Rash may be due to contact dermatitis or eczema. Discussed that the cream should alleviate symptoms within 3-5 days. Informed about the potential for eczema to develop later in life and the possibility of contact dermatitis from allergens. - Prescribe a cream for the rash - Advise to mix the cream with castor oil or lotion and apply to the affected areas - Monitor for improvement over the next 3-5 days

## 2023-02-07 NOTE — Assessment & Plan Note (Signed)
Does not have advanced directives and has not made decisions regarding artificial breathing or feeding tubes. Discussed the importance of having advanced directives and the potential scenarios where they might be needed. Informed about the role of a Child psychotherapist in documenting wishes. - Provide information on advanced directives - Offer referral to a social worker to discuss and document wishes

## 2023-02-07 NOTE — Assessment & Plan Note (Signed)

## 2023-02-07 NOTE — Assessment & Plan Note (Signed)
Reports allergies have returned, causing drainage and nausea. Currently taking Zyrtec and a nasal spray. Examination reveals enlarged lymph nodes, indicating a possible sinus infection. Discussed the use of azithromycin (Z-Pak) for sinus infection and an additional allergy medication as needed. Informed about the potential for sinus infections to recur and the benefits of [place] intervention. - Prescribe azithromycin (Z-Pak) for sinus infection - Send in an additional allergy medication to be taken as needed- xyzal. hold zyrtec when taking - Advise to monitor for improvement and report any persistent symptoms

## 2023-02-07 NOTE — Progress Notes (Signed)
02/07/2023   Gets vaccines at [facility] teeter Vitals:  BP 102/66   Pulse 72   Ht 5\' 2"  (1.575 m)   Wt 117 lb 3.2 oz (53.2 kg)   LMP 04/04/1982   SpO2 97%   BMI 21.44 kg/m   Body mass index is 21.44 kg/m. Karina Small is a 72 y.o. female who presents for Subsequent Medicare Annual Wellness Exam  Care Team Members: Current Providers as of 02/07/2023 PCP: Tollie Eth, NP Encounter Provider: Tollie Eth, NP, starting on Fri Feb 07, 2023 12:00 AM Referring Provider: Genia Del, starting on Fri Feb 07, 2023 12:00 AM Attending Provider: Tollie Eth, NP, starting on Fri Feb 01, 2022  9:49 AM (Active) Nurse Practitioner: Tollie Eth, NP, starting on Fri Feb 07, 2023  8:18 AM (Active)   Method of visit:  in person In the event virtual visit conducted, the patient consented to a virtual visit. Patient consented to have virtual visit and was identified by two identifiers.  Encounter participants: Patient: Karina Small - located AWV Patient Visit Location: In Office Nurse/Provider: Tollie Eth - located Virtual Visit Location Provider: Office/Clinic Others (if applicable): patient only  HPI   Review of Systems:  Neuro: Denies difficulty remembering daily tasks, people, or places.  Ear: Denies difficulty hearing or need to increase volume on television or telephone to hear Eye: Denies visual changes, difficulty reading normal print, or visual field deficits. Cardiac: Denies chest pain, palpitations, dizziness, shortness of breath, pain in lower extremities, or night time waking with shortness of breath. Lung: Denies shortness of breath, difficulty breathing, chronic cough, or dizziness.  GI: Denies changes in bowel habits, blood in stool, difficulty passing stool, decreased intake of food or drink, nausea, or vomiting.  GU: Denies changes in urinary habits, dark urine, malodorous urine, increased or decreased urination, or urinary  incontinence.  MSK: Denies weakness in extremities, difficulty walking, difficulty grasping, or new MSK pain.  Skin: Denies fragile skin, increased bruising. She reports recent pruritic rash on both arms Constitution: Denies fatigue, weakness, or confusion.   Patient rating of health: same as this time last year  Annual Goal:  Goals      Patient Stated     Start my bead business. Making jewelry; making, stringing, and selling.         Hospitalizations in the Past Year: none  ED Visits in the Past Year: Yes, number of visits 1- chest pain  Surgeries in the Past Year: No   Dietary Habits: Eats 2 meals per day. Snacks most of the time Drinks at least  4  8 ounce glasses of water a day  Physical Exam: Yes-  Physical Exam Vitals and nursing note reviewed.  Constitutional:      Appearance: Normal appearance.  HENT:     Head: Normocephalic.     Right Ear: Tympanic membrane normal.     Left Ear: Tympanic membrane normal.     Nose: Nose normal.     Mouth/Throat:     Mouth: Mucous membranes are moist.     Pharynx: Oropharynx is clear.  Eyes:     Conjunctiva/sclera: Conjunctivae normal.     Pupils: Pupils are equal, round, and reactive to light.  Neck:     Vascular: No carotid bruit.  Cardiovascular:     Rate and Rhythm: Normal rate and regular rhythm.     Pulses: Normal pulses.     Heart sounds: Normal  heart sounds.  Pulmonary:     Effort: Pulmonary effort is normal.     Breath sounds: Normal breath sounds.  Abdominal:     General: Bowel sounds are normal.     Palpations: Abdomen is soft.  Musculoskeletal:        General: Normal range of motion.     Cervical back: Normal range of motion.     Right lower leg: No edema.     Left lower leg: No edema.  Skin:    General: Skin is warm and dry.     Capillary Refill: Capillary refill takes less than 2 seconds.  Neurological:     General: No focal deficit present.     Mental Status: She is alert and oriented to  person, place, and time.  Psychiatric:        Mood and Affect: Mood normal.     Activities of Daily Living    02/07/2023    8:16 AM  In your present state of health, do you have any difficulty performing the following activities:  Hearing? 1  Comment trouble hearing  Vision? 0  Difficulty concentrating or making decisions? 0  Walking or climbing stairs? 0  Dressing or bathing? 0  Doing errands, shopping? 0  Preparing Food and eating ? N  Using the Toilet? N  In the past six months, have you accidently leaked urine? N  Do you have problems with loss of bowel control? N  Managing your Medications? N  Managing your Finances? N  Housekeeping or managing your Housekeeping? N    Social Determinants of Health SDOH Screenings   Food Insecurity: No Food Insecurity (02/07/2023)  Housing: Low Risk  (02/07/2023)  Transportation Needs: No Transportation Needs (02/07/2023)  Utilities: Not At Risk (02/07/2023)  Depression (PHQ2-9): Low Risk  (02/07/2023)  Financial Resource Strain: Medium Risk (02/07/2023)  Physical Activity: Sufficiently Active (02/07/2023)  Social Connections: Moderately Integrated (02/07/2023)  Stress: Stress Concern Present (02/07/2023)  Tobacco Use: Low Risk  (02/07/2023)     Tobacco Social History   Tobacco Use  Smoking Status Never  Smokeless Tobacco Never     Counseling given: Not Answered   Functional Status Survey: Is the patient deaf or have difficulty hearing?: Yes (trouble hearing) Does the patient have difficulty seeing, even when wearing glasses/contacts?: No Does the patient have difficulty concentrating, remembering, or making decisions?: No Does the patient have difficulty walking or climbing stairs?: No Does the patient have difficulty dressing or bathing?: No Does the patient have difficulty doing errands alone such as visiting a doctor's office or shopping?: No   Clinical Intake: Pre-visit preparation completed: Yes  Pain :  No/denies pain     BMI - recorded: 21.44 Nutritional Status: BMI of 19-24  Normal Nutritional Risks: None Diabetes: No  Activities of Daily Living: Independent Ambulation: Independent Medication Administration: Independent Home Management: Independent  Barriers to Care Management & Learning: None  Do you feel unsafe in your current relationship?: No Do you feel physically threatened by others?: No Anyone hurting you at home, work, or school?: No Unable to ask?: No Information provided on Community resources: No  How often do you need to have someone help you when you read instructions, pamphlets, or other written materials from your doctor or pharmacy?: 1 - Never What is the last grade level you completed in school?: Bachelors  Interpreter Needed?: No  Information entered by :: S. Polo Mcmartin     02/07/2023    8:37 AM 02/01/2022  8:58 AM 09/30/2016    1:35 PM  Advanced Directives  Does Patient Have a Medical Advance Directive? No No No  Would patient like information on creating a medical advance directive? Yes (MAU/Ambulatory/Procedural Areas - Information given) No - Patient declined     Advanced Directives <no information>     Fall Risk    02/07/2023    8:16 AM 02/01/2022    8:57 AM 12/31/2021    3:40 PM 10/09/2020   10:50 AM 03/24/2020   11:19 AM  Fall Risk   Falls in the past year? 0 0 0 0 0  Number falls in past yr: 0 0 0 0   Injury with Fall? 0 0 0 0   Risk for fall due to :  No Fall Risks No Fall Risks No Fall Risks No Fall Risks  Follow up  Falls evaluation completed Falls evaluation completed Falls evaluation completed Falls evaluation completed   Is the patient's home free of loose throw rugs in walkways, pet beds, electrical cords, etc?   yes      Grab bars in the bathroom? no      Handrails on the stairs?   no      Adequate lighting?   yes  Depression Screen    02/07/2023    8:16 AM 02/01/2022    8:57 AM 12/31/2021    3:40 PM 10/09/2020    10:50 AM  PHQ 2/9 Scores  PHQ - 2 Score 0 0 0 0    Cognitive Function Normal: Yes Exam Completed:         Immunization History  Administered Date(s) Administered   Fluad Quad(high Dose 65+) 03/24/2020, 10/09/2020, 02/01/2022   Fluad Trivalent(High Dose 65+) 02/07/2023   Influenza, High Dose Seasonal PF 01/18/2019   PFIZER(Purple Top)SARS-COV-2 Vaccination 03/27/2019, 04/21/2019, 08/09/2019, 01/03/2020   Pfizer(Comirnaty)Fall Seasonal Vaccine 12 years and older 02/01/2022, 02/07/2023   Pneumococcal Polysaccharide-23 01/18/2019, 03/24/2020, 08/21/2020   Tdap 10/09/2020   Zoster Recombinant(Shingrix) 09/16/2018, 12/06/2018    Screening Tests Health Maintenance  Topic Date Due   DEXA SCAN  Never done   Pneumonia Vaccine 43+ Years old (2 of 2 - PCV) 08/21/2021   Medicare Annual Wellness (AWV)  02/07/2024   MAMMOGRAM  04/05/2024   Colonoscopy  05/25/2030   DTaP/Tdap/Td (2 - Td or Tdap) 10/10/2030   INFLUENZA VACCINE  Completed   COVID-19 Vaccine  Completed   Zoster Vaccines- Shingrix  Completed   HPV VACCINES  Aged Out   Hepatitis C Screening  Discontinued    Health Maintenance Screenings  Health Maintenance Topics with due status: Overdue     Topic Date Due   DEXA SCAN Never done   Pneumonia Vaccine 50+ Years old 08/21/2021    RSV Vaccine: is due and to be scheduled by patient for later completion  Past Medical History:  Diagnosis Date   Allergy    Asthma    Environmental allergies    GERD (gastroesophageal reflux disease)    Osteoporosis    PONV (postoperative nausea and vomiting)    Past Surgical History:  Procedure Laterality Date   ROOT CANAL     Family History  Problem Relation Age of Onset   Heart attack Father    Breast cancer Mother    Osteoporosis Mother    Colon cancer Neg Hx    Stomach cancer Neg Hx    Esophageal cancer Neg Hx    Pancreatic cancer Neg Hx    Liver disease Neg Hx  Social History   Socioeconomic History   Marital  status: Married    Spouse name: Not on file   Number of children: Not on file   Years of education: Not on file   Highest education level: Not on file  Occupational History   Not on file  Tobacco Use   Smoking status: Never   Smokeless tobacco: Never  Vaping Use   Vaping status: Never Used  Substance and Sexual Activity   Alcohol use: No   Drug use: No   Sexual activity: Yes    Birth control/protection: None  Other Topics Concern   Not on file  Social History Narrative   Not on file   Social Drivers of Health   Financial Resource Strain: Medium Risk (02/07/2023)   Overall Financial Resource Strain (CARDIA)    Difficulty of Paying Living Expenses: Somewhat hard  Food Insecurity: No Food Insecurity (02/07/2023)   Hunger Vital Sign    Worried About Running Out of Food in the Last Year: Never true    Ran Out of Food in the Last Year: Never true  Transportation Needs: No Transportation Needs (02/07/2023)   PRAPARE - Administrator, Civil Service (Medical): No    Lack of Transportation (Non-Medical): No  Physical Activity: Sufficiently Active (02/07/2023)   Exercise Vital Sign    Days of Exercise per Week: 5 days    Minutes of Exercise per Session: 30 min  Stress: Stress Concern Present (02/07/2023)   [employer] of Occupational Health - Occupational Stress Questionnaire    Feeling of Stress : To some extent  Social Connections: Moderately Integrated (02/07/2023)   Social Connection and Isolation Panel [NHANES]    Frequency of Communication with Friends and Family: More than three times a week    Frequency of Social Gatherings with Friends and Family: More than three times a week    Attends Religious Services: More than 4 times per year    Active Member of [employer] or Organizations: No    Attends Banker Meetings: Never    Marital Status: Married    Outpatient Encounter Medications as of 02/07/2023  Medication Sig   azithromycin (ZITHROMAX)  250 MG tablet Take 2 tablets on day 1, then 1 tablet daily on days 2 through 5. For sinus infection.   betamethasone dipropionate (DIPROLENE) 0.05 % ointment Apply topically 2 (two) times daily as needed. For skin rash.   cetirizine (ZYRTEC) 10 MG tablet Take 10 mg by mouth daily.   Chlorphen-Phenyleph-ASA (ALKA-SELTZER PLUS COLD) 2-7.8-325 MG TBEF Take 2 tablets by mouth as needed.   levocetirizine (XYZAL) 5 MG tablet Take 1 tablet (5 mg total) by mouth at bedtime as needed for allergies. For allergies.   OVER THE COUNTER MEDICATION Take 1 tablet by mouth every other day. Clear Lax Gas Relief   terbinafine (LAMISIL) 250 MG tablet Take 1 tablet (250 mg total) by mouth daily. For toenails.   [DISCONTINUED] fluticasone (FLONASE) 50 MCG/ACT nasal spray Place 1 spray into both nostrils daily.   EPINEPHrine (EPIPEN 2-PAK) 0.3 mg/0.3 mL IJ SOAJ injection Inject 0.3 mg into the muscle as needed for anaphylaxis.   famotidine (PEPCID) 40 MG tablet Take 40 mg by mouth daily. (Patient not taking: Reported on 02/07/2023)   fluticasone (FLONASE) 50 MCG/ACT nasal spray Place 1 spray into both nostrils daily.   linaclotide (LINZESS) 72 MCG capsule Take 1 capsule (72 mcg total) by mouth daily before breakfast. (Patient not taking: Reported on 02/07/2023)  rizatriptan (MAXALT) 10 MG tablet Take 1 tablet (10 mg total) by mouth as needed for migraine. May repeat in 2 hours if needed. Do not take more than 2 in 24 hours. (Patient not taking: Reported on 02/07/2023)   [DISCONTINUED] amoxicillin-clavulanate (AUGMENTIN) 875-125 MG tablet Take 1 tablet by mouth 2 (two) times daily. (Patient not taking: Reported on 02/07/2023)   [DISCONTINUED] EPINEPHrine (EPIPEN 2-PAK) 0.3 mg/0.3 mL IJ SOAJ injection Inject 0.3 mg into the muscle as needed for anaphylaxis. (Patient not taking: Reported on 02/07/2023)   No facility-administered encounter medications on file as of 02/07/2023.    PLAN  Exercise Activities and Dietary  Recommendations - choose a type of activity I enjoy such as biking, gardening, team sports, walking Cardiac diet  Fall Prevention - always use handrails on the stairs - always wear shoes or slippers with non-slip sole  Preventative Screenings Lung: Low Dose CT Chest recommended if Age 84-80 years, 30 pack-year currently smoking OR have quit w/in 15years. is up to date HIV Screen: (once in lifetime or more often if high risk): is up to date Hep C Screen: is up to date   Orders Placed This Biochemist, clinical Covid -19 Vaccine 68yrs and older   Flu Vaccine Trivalent High Dose (Fluad)   CBC with Differential/Platelet    Release to patient:   Immediate   Comprehensive metabolic panel    Has the patient fasted?:   Yes    Release to patient:   Immediate   Lipid panel    Has the patient fasted?:   Yes    Release to patient:   Immediate   VITAMIN D 25 Hydroxy (Vit-D Deficiency, Fractures)    Release to patient:   Immediate   Ambulatory referral to Social Work    Referral Priority:   Routine    Referral Type:   Consultation    Referral Reason:   Specialty Services Required    Number of Visits Requested:   1     I have personally reviewed and noted the following in the patient's chart:   Medical and social history Use of alcohol, tobacco or illicit drugs  Current medications and supplements Functional ability and status Nutritional status Physical activity Advanced directives List of other physicians Hospitalizations, surgeries, and ER visits in previous 12 months Vitals Screenings to include cognitive, depression, and falls Referrals and appointments  In addition, I have reviewed and discussed with patient certain preventive protocols, quality metrics, and best practice recommendations. A written personalized care plan for preventive services as well as general preventive health recommendations were provided to patient.   Tollie Eth,  NP  02/07/2023

## 2023-02-08 LAB — LIPID PANEL
Cholesterol, Total: 203 mg/dL — ABNORMAL HIGH (ref 100–199)
HDL: 76 mg/dL (ref >39)
LDL CALC COMMENT:: 2.7 ratio (ref 0.0–4.4)
LDL Chol Calc (NIH): 116 mg/dL — ABNORMAL HIGH (ref 0–99)
Triglycerides: 58 mg/dL (ref 0–149)
VLDL Cholesterol Cal: 11 mg/dL (ref 5–40)

## 2023-02-08 LAB — CBC WITH DIFFERENTIAL/PLATELET
Basophils Absolute: 0.1 10*3/uL (ref 0.0–0.2)
Basos: 1 %
EOS (ABSOLUTE): 0.1 10*3/uL (ref 0.0–0.4)
Eos: 1 %
Hematocrit: 35.1 % (ref 34.0–46.6)
Hemoglobin: 11.1 g/dL (ref 11.1–15.9)
Immature Grans (Abs): 0 10*3/uL (ref 0.0–0.1)
Immature Granulocytes: 0 %
Lymphocytes Absolute: 1.6 10*3/uL (ref 0.7–3.1)
Lymphs: 45 %
MCH: 29.4 pg (ref 26.6–33.0)
MCHC: 31.6 g/dL (ref 31.5–35.7)
MCV: 93 fL (ref 79–97)
Monocytes Absolute: 0.4 10*3/uL (ref 0.1–0.9)
Monocytes: 10 %
Neutrophils Absolute: 1.6 10*3/uL (ref 1.4–7.0)
Neutrophils: 43 %
Platelets: 279 10*3/uL (ref 150–450)
RBC: 3.77 x10E6/uL (ref 3.77–5.28)
RDW: 11.5 % — ABNORMAL LOW (ref 11.7–15.4)
WBC: 3.6 10*3/uL (ref 3.4–10.8)

## 2023-02-08 LAB — COMPREHENSIVE METABOLIC PANEL
ALT: 20 IU/L (ref 0–32)
AST: 21 IU/L (ref 0–40)
Albumin: 4.4 g/dL (ref 3.9–4.9)
Alkaline Phosphatase: 57 IU/L (ref 44–121)
BUN/Creatinine Ratio: 19 (ref 12–28)
BUN: 14 mg/dL (ref 8–27)
Bilirubin Total: 0.3 mg/dL (ref 0.0–1.2)
CO2: 26 mmol/L (ref 20–29)
Calcium: 9.7 mg/dL (ref 8.7–10.3)
Chloride: 106 mmol/L (ref 96–106)
Creatinine, Ser: 0.73 mg/dL (ref 0.57–1.00)
Globulin, Total: 2.2 g/dL (ref 1.5–4.5)
Glucose: 94 mg/dL (ref 70–99)
Potassium: 4.4 mmol/L (ref 3.5–5.2)
Sodium: 145 mmol/L — ABNORMAL HIGH (ref 134–144)
Total Protein: 6.6 g/dL (ref 6.0–8.5)
eGFR: 88 mL/min/{1.73_m2} (ref >59)

## 2023-02-08 LAB — VITAMIN D 25 HYDROXY (VIT D DEFICIENCY, FRACTURES): Vit D, 25-Hydroxy: 16.4 ng/mL — ABNORMAL LOW (ref 30.0–100.0)

## 2023-02-14 ENCOUNTER — Other Ambulatory Visit: Payer: Self-pay

## 2023-02-14 MED ORDER — VITAMIN D (ERGOCALCIFEROL) 1.25 MG (50000 UNIT) PO CAPS: 50000.0000 [IU] | ORAL_CAPSULE | ORAL | 3 refills | Status: AC

## 2023-02-18 ENCOUNTER — Telehealth: Payer: Self-pay | Admitting: *Deleted

## 2023-02-18 NOTE — Progress Notes (Signed)
 Complex Care Management Note Care Guide Note  02/18/2023 Name: MARISAL SWAREY MRN: [id number] DOB: 10/01/1950   Complex Care Management Outreach Attempts: An unsuccessful telephone outreach was attempted today to offer the patient information about available complex care management services.  Follow Up Plan:  Additional outreach attempts will be made to offer the patient complex care management information and services.   Encounter Outcome:  No Answer  Harlene Satterfield  Care Coordination Care Guide  Direct Dial: [phone]

## 2023-02-21 NOTE — Progress Notes (Signed)
 Complex Care Management Note Care Guide Note  02/21/2023 Name: Karina Small MRN: [id number] DOB: 1950/11/16   Complex Care Management Outreach Attempts: A second unsuccessful outreach was attempted today to offer the patient with information about available complex care management services.  Follow Up Plan:  Additional outreach attempts will be made to offer the patient complex care management information and services.   Encounter Outcome:  No Answer  Harlene Satterfield  Care Coordination Care Guide  Direct Dial: [phone]

## 2023-02-28 NOTE — Progress Notes (Signed)
Complex Care Management Note Care Guide Note  02/28/2023 Name: Karina Small MRN: [id number] DOB: 05/06/1950   Complex Care Management Outreach Attempts: A third unsuccessful outreach was attempted today to offer the patient with information about available complex care management services.  Follow Up Plan:  No further outreach attempts will be made at this time. We have been unable to contact the patient to offer or enroll patient in complex care management services.  Encounter Outcome:  No Answer  Gwenevere Ghazi  [facility]  [facility], [facility] Guide  Direct Dial: [phone]  Fax [phone]

## 2023-04-30 ENCOUNTER — Telehealth (INDEPENDENT_AMBULATORY_CARE_PROVIDER_SITE_OTHER): Payer: Self-pay | Admitting: Otolaryngology

## 2023-04-30 NOTE — Telephone Encounter (Signed)
 LVM to r/s appt - Dr. Suszanne Conners not in office. [phone] afm

## 2023-05-09 ENCOUNTER — Ambulatory Visit: Admitting: Medical

## 2023-05-09 ENCOUNTER — Encounter: Payer: Self-pay | Admitting: Medical

## 2023-05-09 VITALS — BP 122/72 | HR 78 | Temp 97.9°F | Wt 116.0 lb

## 2023-05-09 DIAGNOSIS — H6123 Impacted cerumen, bilateral: Secondary | ICD-10-CM | POA: Diagnosis not present

## 2023-05-09 DIAGNOSIS — J029 Acute pharyngitis, unspecified: Secondary | ICD-10-CM

## 2023-05-09 DIAGNOSIS — R0982 Postnasal drip: Secondary | ICD-10-CM | POA: Diagnosis not present

## 2023-05-09 DIAGNOSIS — H938X3 Other specified disorders of ear, bilateral: Secondary | ICD-10-CM | POA: Diagnosis not present

## 2023-05-09 MED ORDER — AMOXICILLIN 875 MG PO TABS
875.0000 mg | ORAL_TABLET | Freq: Two times a day (BID) | ORAL | 0 refills | Status: AC
Start: 2023-05-09 — End: 2023-05-19

## 2023-05-09 NOTE — Patient Instructions (Signed)
 Recommendations: Begin amoxicillin antibiotic twice a day for 10 days Continue allergy medicines, Flonase in the morning and Zyrtec at night Get some over-the-counter earwax drops and use them in each ear twice a week for the next month Plan to see [place] back in a month to recheck the earwax Drink plenty of water throughout the day If not much improved within the next week let me know

## 2023-05-09 NOTE — Progress Notes (Signed)
 Subjective:  Karina Small is a 73 y.o. female who presents for Chief Complaint  Patient presents with   other    Ear pain and drainage, lt. Ear stopped up and rt. One hurts, congestion, throat hurts at night, has a lot of mucous and that causes her to cough, loses balance,      Here for c/o bilat ear pain.   Left ear pain and popping, more sharp pain in right ear.  Having sore throat, nasal drainage, congestion.   Ears been hurting for a month or so.   No itchy watery eyes.  No fever, no body aches or chills.  Occasional cough.  Using nasal spray and zyrtec daily.  Sometimes using alka seltzer plus.  No other aggravating or relieving factors.    No other c/o.  The following portions of the patient's history were reviewed and updated as appropriate: allergies, current medications, past family history, past medical history, past social history, past surgical history and problem list.  ROS Otherwise as in subjective above   Objective: BP 122/72   Pulse 78   Temp 97.9 F (36.6 C)   Wt 116 lb (52.6 kg)   LMP 04/04/1982   SpO2 98%   BMI 21.22 kg/m   General appearance: alert, no distress, well developed, well nourished HEENT: normocephalic, sclerae anicteric, conjunctiva pink and moist, TMs with impacted cerumen and more posterior angled canals, nares with some turbinated edema and clear discharge or erythema, pharynx with some post nasal drainage and mild erythema Oral cavity: MMM, no lesions Neck: supple, no lymphadenopathy, no thyromegaly, no masses Heart: RRR, normal S1, S2, no murmurs Lungs: CTA bilaterally, no wheezes, rhonchi, or rales    Assessment: Encounter Diagnoses  Name Primary?   Ear pressure, bilateral Yes   Post-nasal drainage    Sore throat    Impacted cerumen of both ears      Plan: Discussed concerns, symptoms.    Discussed findings.  Discussed risk/benefits of procedure and patient agrees to procedure. used warm water lavage to remove impacted  cerumen from bilat ear canal.  Wasn't successful in complete resolution of cerumen.  Patient tolerated procedure well. Advised they avoid using any cotton swabs or other devices to clean the ear canals.  Use basic hygiene as discussed.  Follow up prn.   Recommendations: Begin amoxicillin antibiotic twice a day for 10 days Continue allergy medicines, Flonase in the morning and Zyrtec at night Get some over-the-counter earwax drops and use them in each ear twice a week for the next month Plan to see [place] back in a month to recheck the earwax Drink plenty of water throughout the day If not much improved within the next week let me know   Karina Small was seen today for other.  Diagnoses and all orders for this visit:  Ear pressure, bilateral  Post-nasal drainage  Sore throat  Impacted cerumen of both ears  Other orders -     amoxicillin (AMOXIL) 875 MG tablet; Take 1 tablet (875 mg total) by mouth 2 (two) times daily for 10 days.    Follow up: prn

## 2023-05-30 ENCOUNTER — Ambulatory Visit (INDEPENDENT_AMBULATORY_CARE_PROVIDER_SITE_OTHER): Payer: BC Managed Care – PPO

## 2023-06-23 LAB — HM MAMMOGRAPHY

## 2023-07-21 ENCOUNTER — Ambulatory Visit (INDEPENDENT_AMBULATORY_CARE_PROVIDER_SITE_OTHER): Admitting: Otolaryngology

## 2023-07-21 ENCOUNTER — Encounter (INDEPENDENT_AMBULATORY_CARE_PROVIDER_SITE_OTHER): Payer: Self-pay | Admitting: Otolaryngology

## 2023-07-21 VITALS — BP 118/55 | HR 84 | Ht 61.0 in | Wt 118.0 lb

## 2023-07-21 DIAGNOSIS — R0981 Nasal congestion: Secondary | ICD-10-CM

## 2023-07-21 DIAGNOSIS — J310 Chronic rhinitis: Secondary | ICD-10-CM

## 2023-07-21 DIAGNOSIS — H6123 Impacted cerumen, bilateral: Secondary | ICD-10-CM

## 2023-07-21 DIAGNOSIS — J343 Hypertrophy of nasal turbinates: Secondary | ICD-10-CM

## 2023-07-21 DIAGNOSIS — J342 Deviated nasal septum: Secondary | ICD-10-CM

## 2023-07-21 DIAGNOSIS — H900 Conductive hearing loss, bilateral: Secondary | ICD-10-CM

## 2023-07-22 NOTE — Progress Notes (Signed)
 Patient ID: Karina Small, female   DOB: 04-30-1950, 73 y.o.   MRN: [id number]  Follow-up: Chronic nasal congestion, hearing loss  HPI: The patient is a 73 year old female who returns today for her follow-up evaluation.  The patient was last seen in October 2024.  At that time, she was complaining of bilateral hearing loss and chronic nasal congestion.  She was noted to have nasal mucosal congestion, nasal septal deviation, and bilateral inferior turbinate hypertrophy.  Her hearing test showed bilateral conductive hearing loss, worse on the right side.  She was treated with Flonase  nasal spray and nasal saline irrigation.  The patient returns today reporting occasional nasal congestion, especially during the allergy season.  She is using Flonase  daily.  She denies any recent change in her hearing.  Exam: General: Communicates without difficulty, well nourished, no acute distress. Head: Normocephalic, no evidence injury, no tenderness, facial buttresses intact without stepoff. Face/sinus: No tenderness to palpation and percussion. Facial movement is normal and symmetric. Eyes: PERRL, EOMI. No scleral icterus, conjunctivae clear. Neuro: CN II exam reveals vision grossly intact.  No nystagmus at any point of gaze. Ears: Auricles well formed without lesions.  Bilateral cerumen impaction.  Nose: External evaluation reveals normal support and skin without lesions.  Dorsum is intact.  Anterior rhinoscopy reveals congested mucosa over anterior aspect of inferior turbinates and intact septum.  No purulence noted. Oral:  Oral cavity and oropharynx are intact, symmetric, without erythema or edema.  Mucosa is moist without lesions. Neck: Full range of motion without pain.  There is no significant lymphadenopathy.  No masses palpable.  Thyroid bed within normal limits to palpation.  Parotid glands and submandibular glands equal bilaterally without mass.  Trachea is midline. Neuro:  CN 2-12 grossly intact.      Procedure: Bilateral cerumen disimpaction Anesthesia: None Description: Under the operating microscope, the cerumen is carefully removed with a combination of cerumen currette, alligator forceps, and suction catheters.  After the cerumen is removed, the TMs are noted to be normal.  No mass, erythema, or lesions. The patient tolerated the procedure well.     Assessment: 1.  Bilateral recurrent cerumen impaction.  After the disimpaction procedure, her ear canals and tympanic membranes are noted to be normal. 2.  Chronic rhinitis with nasal mucosal congestion, nasal septal deviation, and bilateral inferior turbinate hypertrophy. 3.  History of bilateral conductive hearing loss, worse on the right side.  Plan: 1.  Otomicroscopy with bilateral cerumen disimpaction. 2.  The physical exam findings are reviewed with the patient. 3.  Continue with Flonase  nasal spray and nasal saline irrigation daily. 4.  The patient will return for reevaluation in 6 months.

## 2023-08-22 ENCOUNTER — Ambulatory Visit: Admitting: Medical

## 2023-08-22 ENCOUNTER — Encounter: Payer: Self-pay | Admitting: Medical

## 2023-08-22 VITALS — BP 114/70 | HR 68 | Temp 98.3°F | Wt 119.2 lb

## 2023-08-22 DIAGNOSIS — R198 Other specified symptoms and signs involving the digestive system and abdomen: Secondary | ICD-10-CM | POA: Diagnosis not present

## 2023-08-22 DIAGNOSIS — H9202 Otalgia, left ear: Secondary | ICD-10-CM

## 2023-08-22 DIAGNOSIS — J029 Acute pharyngitis, unspecified: Secondary | ICD-10-CM | POA: Diagnosis not present

## 2023-08-22 LAB — POCT RAPID STREP A (OFFICE): Rapid Strep A Screen: NEGATIVE

## 2023-08-22 MED ORDER — AMOXICILLIN 875 MG PO TABS
875.0000 mg | ORAL_TABLET | Freq: Two times a day (BID) | ORAL | 0 refills | Status: AC
Start: 2023-08-22 — End: 2023-09-01

## 2023-08-22 NOTE — Progress Notes (Signed)
 Subjective:  Karina Small is a 73 y.o. female who presents for Chief Complaint  Patient presents with   other    Lt. Ear pain, head ache, drainage now noticed Wednesday night but was much worse Thursday morning, slight cough, brings up mucous, hoarse last night felt mucous stuck in her throat, taking zyrtec and nasal spray, alkaseltzer cold,      Here for c/o headache, earache, sore throat.  Started yesterday morning.  No fever, no NVD, no dizziness.  Has slight cough.  Some sneezing.  Using zyrtec, nasal spray.  No sick contacts.  She has maybe some sinus pressure.  She has seen the dentist in the last few months and she has routine follow-up planned  She does report eating some cherries lately and she wonders if that irritated things in her mouth or could she has an allergy to something she is eating.  Her mouth has been itchy on the inside  No other aggravating or relieving factors.    No other c/o.  Past Medical History:  Diagnosis Date   Allergy    Asthma    Environmental allergies    GERD (gastroesophageal reflux disease)    Osteoporosis    PONV (postoperative nausea and vomiting)    Current Outpatient Medications on File Prior to Visit  Medication Sig Dispense Refill   betamethasone  dipropionate (DIPROLENE ) 0.05 % ointment Apply topically 2 (two) times daily as needed. For skin rash. 50 g 0   cetirizine (ZYRTEC) 10 MG tablet Take 10 mg by mouth daily.     Chlorphen-Phenyleph-ASA (ALKA-SELTZER PLUS COLD) 2-7.8-325 MG TBEF Take 2 tablets by mouth as needed.     EPINEPHrine  (EPIPEN  2-PAK) 0.3 mg/0.3 mL IJ SOAJ injection Inject 0.3 mg into the muscle as needed for anaphylaxis. 2 each 3   famotidine (PEPCID) 40 MG tablet Take 40 mg by mouth daily.     fluticasone  (FLONASE ) 50 MCG/ACT nasal spray Place 1 spray into both nostrils daily. 16 g 3   levocetirizine (XYZAL ) 5 MG tablet Take 1 tablet (5 mg total) by mouth at bedtime as needed for allergies. For allergies. 30 tablet 3    linaclotide  (LINZESS ) 72 MCG capsule Take 1 capsule (72 mcg total) by mouth daily before breakfast. (Patient taking differently: Take 72 mcg by mouth daily before breakfast. prn) 30 capsule 5   OVER THE COUNTER MEDICATION Take 1 tablet by mouth every other day. Clear Lax Gas Relief     rizatriptan  (MAXALT ) 10 MG tablet Take 1 tablet (10 mg total) by mouth as needed for migraine. May repeat in 2 hours if needed. Do not take more than 2 in 24 hours. 10 tablet 3   terbinafine  (LAMISIL ) 250 MG tablet Take 1 tablet (250 mg total) by mouth daily. For toenails. (Patient not taking: Reported on 08/22/2023) 30 tablet 2   Vitamin D , Ergocalciferol , (DRISDOL ) 1.25 MG (50000 UNIT) CAPS capsule Take 1 capsule (50,000 Units total) by mouth every 7 (seven) days. (Patient not taking: Reported on 08/22/2023) 12 capsule 3   No current facility-administered medications on file prior to visit.     The following portions of the patient's history were reviewed and updated as appropriate: allergies, current medications, past family history, past medical history, past social history, past surgical history and problem list.  ROS Otherwise as in subjective above    Objective: BP 114/70   Pulse 68   Temp 98.3 F (36.8 C)   Wt 119 lb 3.2 oz (54.1 kg)  LMP 04/04/1982   SpO2 98%   BMI 22.52 kg/m   General appearence: alert, no distress, WD/WN,  HEENT: normocephalic, sclerae anicteric, TMs unremarkable, narrow ear canals send makes exam somewhat difficult, nares patent, no discharge or erythema, pharynx with some erythema and postnasal drainage Oral cavity: MMM, there seems to be some gingival disease in general.  No obvious abscess Neck: supple, no lymphadenopathy, no thyromegaly, no masses   Assessment: Encounter Diagnoses  Name Primary?   Sore throat Yes   Discomfort of left ear    Mouth symptom      Plan: We discussed them the possible causes.  No obvious ear infection.  She does have irritated  throat.  Strep swab negative  Recommendations: Drink plenty of water throughout the day, 60 to 80 ounces of water daily You can use Tylenol over-the-counter 2-3 times daily over the weekend for pain Use salt water gargles 2-3 times a day for the next few days You can use warm fluids to soothe the throat such as coffee or tea You can use over-the-counter sore throat spray if desired Continue your Zyrtec and nasal spray for the next 4 to 5 days given the congestion drainage you are having If worse sinus pressure or ear pain over the weekend then begin amoxicillin  antibiotic sent to the pharmacy Avoid cherries for 1-2 weeks. Once improved you can try 10 cherries at a time and see if any problems flare back up.  [place] was seen today for other.  Diagnoses and all orders for this visit:  Sore throat  Discomfort of left ear  Mouth symptom    Follow up: prn

## 2023-08-22 NOTE — Patient Instructions (Signed)
 Recommendations: Drink plenty of water throughout the day, 60 to 80 ounces of water daily You can use Tylenol over-the-counter 2-3 times daily over the weekend for pain Use salt water gargles 2-3 times a day for the next few days You can use warm fluids to soothe the throat such as coffee or tea You can use over-the-counter sore throat spray if desired Continue your Zyrtec and nasal spray for the next 4 to 5 days given the congestion drainage you are having If worse sinus pressure or ear pain over the weekend then begin amoxicillin  antibiotic sent to the pharmacy Avoid cherries for 1-2 weeks. Once improved you can try 10 cherries at a time and see if any problems flare back up.

## 2023-11-14 ENCOUNTER — Encounter: Payer: Self-pay | Admitting: Family Medicine

## 2023-11-14 ENCOUNTER — Ambulatory Visit: Admitting: Family Medicine

## 2023-11-14 VITALS — BP 130/74 | HR 71 | Wt 118.6 lb

## 2023-11-14 DIAGNOSIS — M25511 Pain in right shoulder: Secondary | ICD-10-CM

## 2023-11-14 DIAGNOSIS — J3489 Other specified disorders of nose and nasal sinuses: Secondary | ICD-10-CM

## 2023-11-14 DIAGNOSIS — B351 Tinea unguium: Secondary | ICD-10-CM | POA: Diagnosis not present

## 2023-11-14 DIAGNOSIS — G8929 Other chronic pain: Secondary | ICD-10-CM | POA: Diagnosis not present

## 2023-11-14 MED ORDER — TERBINAFINE HCL 250 MG PO TABS: 250.0000 mg | ORAL_TABLET | Freq: Every day | ORAL | 2 refills | Status: AC

## 2023-11-14 MED ORDER — MELOXICAM 15 MG PO TABS
15.0000 mg | ORAL_TABLET | Freq: Every day | ORAL | 0 refills | Status: AC
Start: 2023-11-14 — End: ?

## 2023-11-14 NOTE — Patient Instructions (Addendum)
 Please go to [address] to get your xrays.   Follow up with me in 3-4 weeks   Please take yoru terbinafine  for your toenails everyday for the the next 3 months  Please take your meloxicam 15mg  once a day for 2 weeks then as needed.

## 2023-11-14 NOTE — Progress Notes (Signed)
   Name: Karina Small   Date of Visit: 11/14/23   Date of last visit with me: Visit date not found   CHIEF COMPLAINT:  Chief Complaint  Patient presents with   Acute Visit    Right shoulder pain, sinus pain, toe fungus.         HPI:  Discussed the use of AI scribe software for clinical note transcription with the patient, who gave verbal consent to proceed.  History of Present Illness Karina Small is a 73 year old female who presents with severe shoulder pain and toenail issues.  She has been experiencing significant shoulder pain for the past two days, which has intensified to the point where she cannot move her shoulder without severe discomfort. This pain is an exacerbation of an old issue that began approximately thirty years ago, characterized by intermittent episodes of discomfort, but never as severe as the current episode. The pain is persistent and worsens at night.  She is experiencing issues with her toenails, noting that one toenail is particularly problematic, and another has also worsened. She was previously prescribed an oral antifungal medication but did not complete the full course of treatment, which was intended to last for three months. She uses topical treatments without significant improvement.  She reports sinus congestion and a sensation of ear fullness, particularly in one ear, which is accompanied by popping sounds. She has used Flonase  in the past but discontinued it when symptoms improved.     OBJECTIVE:       02/07/2023    8:16 AM  Depression screen PHQ 2/9  Decreased Interest 0  Down, Depressed, Hopeless 0  PHQ - 2 Score 0     BP Readings from Last 3 Encounters:  11/14/23 130/74  08/22/23 114/70  07/21/23 (!) 118/55    BP 130/74   Pulse 71   Wt 118 lb 9.6 oz (53.8 kg)   LMP 04/04/1982   SpO2 97%   BMI 22.41 kg/m    Physical Exam    Physical Exam  ASSESSMENT/PLAN:   Assessment & Plan Chronic right shoulder  pain  Toenail fungus  Sinus pain    Assessment and Plan Assessment & Plan Right shoulder pain likely due to osteoarthritis Chronic right shoulder pain, exacerbated recently, likely due to osteoarthritis given the long history and severity. - Order x-ray of the right shoulder to evaluate for osteoarthritis. - Prescribe meloxicam once daily with food for pain relief. Advise against concurrent use of ibuprofen  or Aleve. - Advise applying ice to the shoulder to reduce inflammation. - Schedule follow-up appointment in three to four weeks to review x-ray results and discuss further management options, including potential steroid injection if indicated.  Onychomycosis of toenails Chronic onychomycosis with incomplete previous treatment. Oral antifungal therapy preferred for efficacy. - Prescribe oral antifungal medication for a total duration of 120 days, ensuring completion of all refills. - Monitor liver function in four weeks after starting the medication to ensure safety.  Chronic sinus congestion Chronic sinus congestion with ear fullness, improved with nasal steroids but recurring upon discontinuation. Consistent use required for management. - Advise daily use of Flonase  (fluticasone ) nasal spray to reduce sinus secretions and alleviate congestion. - Reassess symptoms in three to four weeks during follow-up appointment.     Dawsyn Ramsaran A. Vita MD [facility]

## 2023-11-17 ENCOUNTER — Ambulatory Visit
Admission: RE | Admit: 2023-11-17 | Discharge: 2023-11-17 | Disposition: A | Discharge status: Home / Self Care | Source: Ambulatory Visit | Attending: Family Medicine | Admitting: Family Medicine

## 2023-11-17 DIAGNOSIS — M7591 Shoulder lesion, unspecified, right shoulder: Secondary | ICD-10-CM | POA: Diagnosis not present

## 2023-11-17 DIAGNOSIS — G8929 Other chronic pain: Secondary | ICD-10-CM

## 2023-12-15 ENCOUNTER — Ambulatory Visit: Admitting: Family Medicine

## 2023-12-15 ENCOUNTER — Encounter: Payer: Self-pay | Admitting: Family Medicine

## 2023-12-15 ENCOUNTER — Ambulatory Visit
Admission: RE | Admit: 2023-12-15 | Discharge: 2023-12-15 | Disposition: A | Discharge status: Home / Self Care | Source: Ambulatory Visit | Attending: Family Medicine | Admitting: Family Medicine

## 2023-12-15 VITALS — BP 118/72 | HR 73 | Wt 119.8 lb

## 2023-12-15 DIAGNOSIS — M79672 Pain in left foot: Secondary | ICD-10-CM

## 2023-12-15 DIAGNOSIS — M25572 Pain in left ankle and joints of left foot: Secondary | ICD-10-CM | POA: Diagnosis not present

## 2023-12-15 MED ORDER — DICLOFENAC SODIUM 1 % EX GEL: 4.0000 g | Freq: Four times a day (QID) | CUTANEOUS | 1 refills | Status: AC

## 2023-12-15 NOTE — Progress Notes (Signed)
 Name: Karina Small   Date of Visit: 12/15/23   Date of last visit with me: 11/14/2023   CHIEF COMPLAINT:  Chief Complaint  Patient presents with   Follow-up    Follow up. Sharp, stabbing pain in foot by arch under ankle, consistent, happens when sitting down and standing up, lots of foot cramps. Lots of shoulder pain, hurts when moves it, started last night. Toe is not getting any better.        HPI:  Discussed the use of AI scribe software for clinical note transcription with the patient, who gave verbal consent to proceed.  History of Present Illness Karina Small is a 73 year old female who presents with persistent shoulder pain and new onset ankle pain.  She has been experiencing persistent aching in her shoulder, which has worsened over the past week. The pain is more pronounced with weather changes and is described as deep inside the shoulder. Previous medications and a melanoma scan did not alleviate the pain. The pain is tolerable when the shoulder is not moved, but movement triggers pain that 'starts and then it never stops'. She has a history of receiving injections for shoulder pain, which were not effective in the past.  She reports a new onset of sharp pain in her ankle, which began approximately two weeks ago. The pain is sudden and sharp, occurring while standing or sitting, and is severe enough to almost cause her to scream. No prior injury to the ankle. She is uncertain if the ankle pain is related to her toenail issue.  Her current medications include a prescription for a medication to treat the toenail condition. She has used Voltaren gel in the past and is familiar with its application for pain relief.     OBJECTIVE:       02/07/2023    8:16 AM  Depression screen PHQ 2/9  Decreased Interest 0  Down, Depressed, Hopeless 0  PHQ - 2 Score 0     BP Readings from Last 3 Encounters:  12/15/23 118/72  11/14/23 130/74  08/22/23 114/70    BP 118/72    Pulse 73   Wt 119 lb 12.8 oz (54.3 kg)   LMP 04/04/1982   SpO2 98%   BMI 22.64 kg/m    Physical Exam    Physical Exam  ASSESSMENT/PLAN:   Assessment & Plan Foot pain, left    Assessment and Plan Assessment & Plan Right shoulder pain due to osteoarthritis and rotator cuff pain Pain likely from arthritis and rotator cuff issues. X-rays show arthritic changes and AC joint arthritis. Previous injections ineffective. - Follow-up in two weeks to assess need for ultrasound-guided steroid injection. - Discuss potential immediate pain relief from numbing medicine in injection as diagnostic tool.  Ankle pain due to arthritis Sharp, intermittent pain likely from arthritis. X-rays show degenerative changes. Pain occurs when standing and sitting. - Order x-rays of the ankle to assess extent of degenerative changes. - Prescribe Voltaren gel for localized pain relief. - Consider custom orthotics if pain persists.  Onychomycosis of toenails Currently on medication. Toenail appears black. Emphasized completing medication to ensure eradication. - Continue current antifungal medication for three more months. - Refer to podiatry for potential toenail removal if infection persists after medication.  General Health Maintenance Uncertain about flu vaccine and has not received new COVID-19 vaccine. Discussed importance of vaccinations given her age. - Verify if she has received flu vaccine this year and administer if not. - Discuss  COVID-19 vaccine and consider administration based on current guidelines.     Channing Yeager A. Vita MD [facility]

## 2023-12-29 ENCOUNTER — Ambulatory Visit: Admitting: Family Medicine

## 2024-01-26 ENCOUNTER — Ambulatory Visit (INDEPENDENT_AMBULATORY_CARE_PROVIDER_SITE_OTHER): Admitting: Otolaryngology

## 2024-02-16 ENCOUNTER — Ambulatory Visit: Payer: BC Managed Care – PPO | Admitting: Nurse Practitioner

## 2024-02-16 NOTE — Progress Notes (Deleted)
seno

## 2024-02-16 NOTE — Progress Notes (Deleted)
 Bone Density / DEXA {SEHMDOC:34218}, Medicare Wellness Visit {SEHMDOC:34218}, Pneumonia Vaccine {SEHMDOC:34218}, and Influenza Vaccine {SEHMDOC:34218}  Catheline Doing, DNP, AGNP-c [facility] [address] [place], [place] [address] Main Office [phone] VISIT TYPE: CPE on 02/16/2024 There were no vitals filed for this visit. There is no height or weight on file to calculate BMI.  Wt Readings from Last 3 Encounters:  12/15/23 119 lb 12.8 oz (54.3 kg)  11/14/23 118 lb 9.6 oz (53.8 kg)  08/22/23 119 lb 3.2 oz (54.1 kg)     Subjective:  No chief complaint on file.   *** {Karina Small; complete female:19594::Pertinent items are noted in HPI.}     02/07/2023    8:16 AM 02/01/2022    8:57 AM 12/31/2021    3:40 PM 10/09/2020   10:50 AM 03/24/2020   11:19 AM  Depression screen PHQ 2/9  Decreased Interest 0 0 0 0 0  Down, Depressed, Hopeless 0 0 0 0 0  PHQ - 2 Score 0 0 0 0 0        No data to display             02/07/2023    8:16 AM 02/01/2022    8:57 AM 12/31/2021    3:40 PM 10/09/2020   10:50 AM 03/24/2020   11:19 AM  Fall Risk   Falls in the past year? 0 0 0 0 0  Number falls in past yr: 0 0 0 0   Injury with Fall? 0  0  0  0    Risk for fall due to :  No Fall Risks No Fall Risks No Fall Risks No Fall Risks  Follow up  Falls evaluation completed  Falls evaluation completed  Falls evaluation completed  Falls evaluation completed      Data saved with a previous flowsheet row definition   Past medical history, surgical history, medications, allergies, family history and social history reviewed with patient today and changes made to appropriate areas of the chart.      Objective:    Physical Exam      Assessment & Plan:   Assessment & Plan  NEXT PREVENTATIVE PHYSICAL DUE IN 1 YEAR.    PATIENT COUNSELING PROVIDED FOR ALL ADULT PATIENTS: A well balanced diet low in saturated fats, cholesterol, and moderation in carbohydrates.  This can be as  simple as monitoring portion sizes and cutting back on sugary beverages such as soda and juice to start with.    Daily water consumption of at least 64 ounces.  Physical activity at least 180 minutes per week.  If just starting out, start 10 minutes a day and work your way up.   This can be as simple as taking the stairs instead of the elevator and walking 2-3 laps around the office  purposefully every day.   STD protection, partner selection, and regular testing if high risk.  Limited consumption of alcoholic beverages if alcohol is consumed. For men, I recommend no more than 14 alcoholic beverages per week, spread out throughout the week (max 2 per day). Avoid binge drinking or consuming large quantities of alcohol in one setting.  Please let me know if you feel you may need help with reduction or quitting alcohol consumption.   Avoidance of nicotine, if used. Please let me know if you feel you may need help with reduction or quitting nicotine use.   Daily mental health attention. This can be in the form of 5 minute daily meditation, prayer, journaling,  yoga, reflection, etc.  Purposeful attention to your emotions and mental state can significantly improve your overall wellbeing  and  Health.  Please know that I am here to help you with all of your health care goals and am happy to work with you to find a solution that works best for you.  The greatest advice I have received with any changes in life are to take it one step at a time, that even means if all you can focus on is the next 60 seconds, then do that and celebrate your victories.  With any changes in life, you will have set backs, and that is OK. The important thing to remember is, if you have a set back, it is not a failure, it is an opportunity to try again! Screening Testing Mammogram Every 1 -2 years based on history and risk factors Starting at age 26 Pap Smear Ages 21-39 every 3 years Ages 40-65 every 5 years with  HPV testing More frequent testing may be required based on results and history Colon Cancer Screening Every 1-10 years based on test performed, risk factors, and history Starting at age 42 Bone Density Screening Every 2-10 years based on history Starting at age 30 for women Recommendations for men differ based on medication usage, history, and risk factors AAA Screening One time ultrasound Men 63-21 years old who have every smoked Lung Cancer Screening Low Dose Lung CT every 12 months Age 90-80 years with a 30 pack-year smoking history who still smoke or who have quit within the last 15 years   Screening Labs Routine  Labs: Complete Blood Count (CBC), Complete Metabolic Panel (CMP), Cholesterol (Lipid Panel) Every 6-12 months based on history and medications May be recommended more frequently based on current conditions or previous results Hemoglobin A1c Lab Every 3-12 months based on history and previous results Starting at age 66 or earlier with diagnosis of diabetes, high cholesterol, BMI >26, and/or risk factors Frequent monitoring for patients with diabetes to ensure blood sugar control Thyroid Panel (TSH) Every 6 months based on history, symptoms, and risk factors May be repeated more often if on medication HIV One time testing for all patients 19 and older May be repeated more frequently for patients with increased risk factors or exposure Hepatitis C One time testing for all patients 94 and older May be repeated more frequently for patients with increased risk factors or exposure Gonorrhea, Chlamydia Every 12 months for all sexually active persons 13-24 years Additional monitoring may be recommended for those who are considered high risk or who have symptoms Every 12 months for any woman on birth control, regardless of sexual activity PSA Men 47-20 years old with risk factors Additional screening may be recommended from age 70-69 based on risk factors, symptoms, and  history  Vaccine Recommendations Tetanus Booster All adults every 10 years Flu Vaccine All patients 6 months and older every year COVID Vaccine All patients 12 years and older Initial dosing with booster May recommend additional booster based on age and health history HPV Vaccine 2 doses all patients age 5-26 Dosing may be considered for patients over 26 Shingles Vaccine (Shingrix) 2 doses all adults 55 years and older Pneumonia (Pneumovax 6) All adults 65 years and older May recommend earlier dosing based on health history One year apart from Prevnar 35 Pneumonia (Prevnar 45) All adults 65 years and older Dosed 1 year after Pneumovax 23 Pneumonia (Prevnar 20) One time alternative to the two dosing of 13 and  23 For all adults with initial dose of 23, 20 is recommended 1 year later For all adults with initial dose of 13, 23 is still recommended as second option 1 year later

## 2024-02-19 ENCOUNTER — Encounter: Payer: Self-pay | Admitting: Nurse Practitioner
